# Patient Record
Sex: Male | Born: 1970 | Race: White | Hispanic: No | Smoking: Never smoker
Health system: Southern US, Community
[De-identification: ages and names within clinical notes are randomized; demographics above are authoritative.]

## PROBLEM LIST (undated history)

## (undated) DIAGNOSIS — N200 Calculus of kidney: Secondary | ICD-10-CM

## (undated) DIAGNOSIS — R300 Dysuria: Secondary | ICD-10-CM

## (undated) DIAGNOSIS — R000 Tachycardia, unspecified: Secondary | ICD-10-CM

## (undated) DIAGNOSIS — F32A Depression, unspecified: Secondary | ICD-10-CM

## (undated) DIAGNOSIS — M109 Gout, unspecified: Secondary | ICD-10-CM

## (undated) DIAGNOSIS — I499 Cardiac arrhythmia, unspecified: Secondary | ICD-10-CM

## (undated) DIAGNOSIS — M1A9XX0 Chronic gout, unspecified, without tophus (tophi): Secondary | ICD-10-CM

## (undated) DIAGNOSIS — F329 Major depressive disorder, single episode, unspecified: Secondary | ICD-10-CM

## (undated) DIAGNOSIS — Z87442 Personal history of urinary calculi: Secondary | ICD-10-CM

## (undated) DIAGNOSIS — J189 Pneumonia, unspecified organism: Secondary | ICD-10-CM

## (undated) DIAGNOSIS — I10 Essential (primary) hypertension: Secondary | ICD-10-CM

## (undated) DIAGNOSIS — E119 Type 2 diabetes mellitus without complications: Secondary | ICD-10-CM

## (undated) DIAGNOSIS — M1990 Unspecified osteoarthritis, unspecified site: Secondary | ICD-10-CM

## (undated) DIAGNOSIS — R3915 Urgency of urination: Secondary | ICD-10-CM

## (undated) DIAGNOSIS — F419 Anxiety disorder, unspecified: Secondary | ICD-10-CM

## (undated) DIAGNOSIS — R319 Hematuria, unspecified: Secondary | ICD-10-CM

## (undated) DIAGNOSIS — R7303 Prediabetes: Secondary | ICD-10-CM

## (undated) HISTORY — PX: WISDOM TOOTH EXTRACTION: SHX21

## (undated) HISTORY — PX: EYE SURGERY: SHX253

## (undated) HISTORY — PX: OTHER SURGICAL HISTORY: SHX169

## (undated) HISTORY — DX: Gout, unspecified: M10.9

---
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
## 2003-03-24 ENCOUNTER — Emergency Department (HOSPITAL_COMMUNITY): Admission: AD | Admit: 2003-03-24 | Discharge: 2003-03-24 | Payer: Self-pay | Admitting: Family Medicine

## 2012-11-09 ENCOUNTER — Ambulatory Visit: Payer: Self-pay | Admitting: Family Medicine

## 2014-08-15 ENCOUNTER — Other Ambulatory Visit: Payer: Self-pay | Admitting: Family Medicine

## 2015-01-01 ENCOUNTER — Ambulatory Visit (INDEPENDENT_AMBULATORY_CARE_PROVIDER_SITE_OTHER): Payer: 59 | Admitting: Physician Assistant

## 2015-01-01 ENCOUNTER — Telehealth: Payer: Self-pay | Admitting: Family Medicine

## 2015-01-01 VITALS — BP 124/76 | HR 95 | Temp 98.6°F | Resp 14 | Ht 71.0 in | Wt 228.0 lb

## 2015-01-01 DIAGNOSIS — F329 Major depressive disorder, single episode, unspecified: Secondary | ICD-10-CM

## 2015-01-01 DIAGNOSIS — Z139 Encounter for screening, unspecified: Secondary | ICD-10-CM

## 2015-01-01 DIAGNOSIS — F32A Depression, unspecified: Secondary | ICD-10-CM | POA: Insufficient documentation

## 2015-01-01 LAB — POCT CBC
Granulocyte percent: 69.9 %G (ref 37–80)
HCT, POC: 50 % (ref 43.5–53.7)
Hemoglobin: 17.2 g/dL (ref 14.1–18.1)
Lymph, poc: 2.9 (ref 0.6–3.4)
MCH, POC: 30.7 pg (ref 27–31.2)
MCHC: 34.4 g/dL (ref 31.8–35.4)
MCV: 89.2 fL (ref 80–97)
MID (cbc): 0.7 (ref 0–0.9)
MPV: 8.3 fL (ref 0–99.8)
POC Granulocyte: 8.2 — AB (ref 2–6.9)
POC LYMPH PERCENT: 24.4 %L (ref 10–50)
POC MID %: 5.7 %M (ref 0–12)
Platelet Count, POC: 227 10*3/uL (ref 142–424)
RBC: 5.61 M/uL (ref 4.69–6.13)
RDW, POC: 13.7 %
WBC: 11.8 10*3/uL — AB (ref 4.6–10.2)

## 2015-01-01 MED ORDER — CLONAZEPAM 1 MG PO TABS: 0.5000 mg | ORAL_TABLET | Freq: Two times a day (BID) | ORAL | Status: DC

## 2015-01-01 MED ORDER — FLUOXETINE HCL (PMDD) 20 MG PO CAPS: 20.0000 mg | ORAL_CAPSULE | Freq: Every day | ORAL | Status: DC

## 2015-01-01 NOTE — Telephone Encounter (Signed)
Received a call from the answering service that he did not get one of his rx that he needed.  It was just a mis-understanding, issue resolved

## 2015-01-01 NOTE — Progress Notes (Signed)
01/01/2015 2:29 PM   DOB: 02/13/1970 / MRN: [id number]  SUBJECTIVE:  Derrick Barr is a 44 y.o. male presenting for anxiousness and depression that started roughly 1 year ago after a separation with his wife. 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  Reports that his concentration is so bad that he boss has started to notice.  Complains of early morning awakenings roughly twice a week, and states it takes him about 30 minutes to go back to sleep.  Also, he feels that he has slowed down in his thinking, speaking and moving.  Denies SI/HI today, but did feel this way somewhat near the end of the relationship last year.       Depression screen PHQ 2/9 01/01/2015  Decreased Interest 3  Down, Depressed, Hopeless 3  PHQ - 2 Score 6  Altered sleeping 2  Tired, decreased energy 3  Change in appetite 2  Feeling bad or failure about yourself  3  Trouble concentrating 2  Moving slowly or fidgety/restless 2  Suicidal thoughts 1  PHQ-9 Score 21  Difficult doing work/chores Very difficult      He has No Known Allergies.   He  has a past medical history of Gout.    He  reports that he has never smoked. He does not have any smokeless tobacco history on file. He reports that he does not drink alcohol or use illicit drugs. He  has no sexual activity history on file. The patient  has no past surgical history on file.  His family history is not on file.  Review of Systems  Constitutional: Negative for fever and chills.  Eyes: Negative for blurred vision.  Respiratory: Negative for cough and shortness of breath.   Cardiovascular: Negative for chest pain.  Gastrointestinal: Negative for nausea and abdominal pain.  Genitourinary: Negative for dysuria, urgency and frequency.  Musculoskeletal: Negative for myalgias.  Skin: Negative for rash.  Neurological: Negative for dizziness, tingling  and headaches.  Psychiatric/Behavioral: Negative for depression. The patient is not nervous/anxious.     Problem list and medications reviewed and updated by myself where necessary, and exist elsewhere in the encounter.   OBJECTIVE:  BP 124/76 mmHg  Pulse 95  Temp(Src) 98.6 F (37 C) (Oral)  Resp 14  Ht 5\' 11"  (1.803 m)  Wt 228 lb (103.42 kg)  BMI 31.81 kg/m2  SpO2 98% CrCl cannot be calculated (Patient has no serum creatinine result on file.).  Physical Exam  Constitutional: He is oriented to person, place, and time. He appears well-developed. He does not appear ill.  Eyes: Conjunctivae and EOM are normal. Pupils are equal, round, and reactive to light.  Cardiovascular: Normal rate.   Pulmonary/Chest: Effort normal.  Abdominal: He exhibits no distension.  Musculoskeletal: Normal range of motion.  Neurological: He is alert and oriented to person, place, and time. No cranial nerve deficit. Coordination normal.  Skin: Skin is warm and dry. He is not diaphoretic.  Psychiatric: He has a normal mood and affect.  Nursing note and vitals reviewed.   No results found for this or any previous visit (from the past 48 hour(s)).  ASSESSMENT AND PLAN  Derrick Barr was seen today for anxiety and depression.  Diagnoses and all orders for this visit:  Depression (emotion): Patient with a significant psychological burden.  Will treat with Prozac 20 mg and see him back  in 30 days for medication titration.  Klonopin for now for any agitation associated with SSRI.  Will send him to psychotherapy.  -     Fluoxetine HCl, PMDD, 20 MG CAPS; Take 1 capsule (20 mg total) by mouth daily. -     clonazePAM (KLONOPIN) 1 MG tablet; Take 0.5-1 tablets (0.5-1 mg total) by mouth 2 (two) times daily. Take for anxiety only. -     Ambulatory referral to Psychology  Screening -     POCT CBC -     TSH    The patient was advised to call or return to clinic if he does not see an improvement in symptoms or to seek  the care of the closest emergency department if he worsens with the above plan.   [name], MHS, PA-C [facility] 01/01/2015 2:29 PM

## 2015-01-02 LAB — TSH: TSH: 1.225 u[IU]/mL (ref 0.350–4.500)

## 2015-02-01 ENCOUNTER — Ambulatory Visit: Payer: 59 | Admitting: Physician Assistant

## 2015-07-17 ENCOUNTER — Other Ambulatory Visit: Payer: Self-pay | Admitting: Family Medicine

## 2015-11-26 ENCOUNTER — Emergency Department (HOSPITAL_COMMUNITY)
Admission: EM | Admit: 2015-11-26 | Discharge: 2015-11-26 | Disposition: A | Discharge status: Home / Self Care | Payer: PRIVATE HEALTH INSURANCE | Attending: Emergency Medicine | Admitting: Emergency Medicine

## 2015-11-26 ENCOUNTER — Emergency Department (HOSPITAL_COMMUNITY): Payer: PRIVATE HEALTH INSURANCE

## 2015-11-26 DIAGNOSIS — R109 Unspecified abdominal pain: Secondary | ICD-10-CM

## 2015-11-26 DIAGNOSIS — N23 Unspecified renal colic: Secondary | ICD-10-CM

## 2015-11-26 DIAGNOSIS — N202 Calculus of kidney with calculus of ureter: Secondary | ICD-10-CM | POA: Insufficient documentation

## 2015-11-26 DIAGNOSIS — R1032 Left lower quadrant pain: Secondary | ICD-10-CM | POA: Diagnosis present

## 2015-11-26 LAB — BASIC METABOLIC PANEL
Anion gap: 11 (ref 5–15)
BUN: 11 mg/dL (ref 6–20)
CO2: 25 mmol/L (ref 22–32)
Calcium: 9.6 mg/dL (ref 8.9–10.3)
Chloride: 104 mmol/L (ref 101–111)
Creatinine, Ser: 1.46 mg/dL — ABNORMAL HIGH (ref 0.61–1.24)
GFR calc Af Amer: >60 mL/min (ref >60)
GFR calc non Af Amer: 56 mL/min — ABNORMAL LOW (ref >60)
Glucose, Bld: 132 mg/dL — ABNORMAL HIGH (ref 65–99)
Potassium: 3.8 mmol/L (ref 3.5–5.1)
Sodium: 140 mmol/L (ref 135–145)

## 2015-11-26 LAB — CBC
HCT: 49.6 % (ref 39.0–52.0)
Hemoglobin: 17.3 g/dL — ABNORMAL HIGH (ref 13.0–17.0)
MCH: 31.1 pg (ref 26.0–34.0)
MCHC: 34.9 g/dL (ref 30.0–36.0)
MCV: 89.2 fL (ref 78.0–100.0)
Platelets: 296 10*3/uL (ref 150–400)
RBC: 5.56 MIL/uL (ref 4.22–5.81)
RDW: 12.9 % (ref 11.5–15.5)
WBC: 17.4 10*3/uL — ABNORMAL HIGH (ref 4.0–10.5)

## 2015-11-26 MED ORDER — OXYCODONE-ACETAMINOPHEN 5-325 MG PO TABS
ORAL_TABLET | ORAL | Status: AC
  Filled 2015-11-26: qty 1

## 2015-11-26 MED ORDER — HYDROMORPHONE HCL 2 MG/ML IJ SOLN
INTRAMUSCULAR | Status: AC
  Filled 2015-11-26: qty 1

## 2015-11-26 MED ORDER — ONDANSETRON HCL 4 MG/2ML IJ SOLN
INTRAMUSCULAR | Status: AC
  Filled 2015-11-26: qty 2

## 2015-11-26 MED ORDER — ONDANSETRON 4 MG PO TBDP
ORAL_TABLET | ORAL | Status: AC
  Filled 2015-11-26: qty 1

## 2015-11-26 MED ORDER — ONDANSETRON HCL 4 MG PO TABS: 4.0000 mg | ORAL_TABLET | Freq: Four times a day (QID) | ORAL | 0 refills | Status: DC

## 2015-11-26 MED ORDER — TAMSULOSIN HCL 0.4 MG PO CAPS: 0.4000 mg | ORAL_CAPSULE | Freq: Every day | ORAL | 0 refills | Status: DC

## 2015-11-26 MED ORDER — OXYCODONE-ACETAMINOPHEN 5-325 MG PO TABS: 1.0000 | ORAL_TABLET | ORAL | 0 refills | Status: DC | PRN

## 2015-11-26 NOTE — ED Provider Notes (Signed)
[facility]-EMERGENCY DEPT Provider Note   CSN: [id number] Arrival date & time: 11/26/15  0153   By signing my name below, I, Valentino SaxonBianca Contreras, attest that this documentation has been prepared under the direction and in the presence of Gilda Creasehristopher J Pollina, MD. Electronically Signed: Valentino SaxonBianca Contreras, ED Scribe. 11/26/15. 3:11 AM.  History   Chief Complaint No chief complaint on file.  The history is provided by the patient. No language interpreter was used.    HPI Comments: Derrick Barr is a 45 y.o. male who presents to the Emergency Department complaining of moderate, constant, lower left abdominal and flank pain with associated back pain onset at ~6pm yesterday. Pt reports his abdominal and flank pain radiates to his lower left back accompanied by nausea. Pt states he was driving home from work when he began to have a stabbing pain to his left lower abdomen. Per pt, he notes having a hx of one kidney stone. He describes his pain is worse than previous symptoms. Pt reports no modifying factors noted. He also notes his urine has blood and brown color to it. He denies dysuria, fever. No additional complaints at this time.   Past Medical History:  Diagnosis Date  . Gout     Patient Active Problem List   Diagnosis Date Noted  . Depression (emotion) 01/01/2015    No past surgical history on file.     Home Medications    Prior to Admission medications   Medication Sig Start Date End Date Taking? Authorizing Provider  allopurinol (ZYLOPRIM) 100 MG tablet TAKE 1 TABLET TWICE A DAY 08/15/14  Yes Dennis E Chrismon, PA  clonazePAM (KLONOPIN) 1 MG tablet Take 0.5-1 tablets (0.5-1 mg total) by mouth 2 (two) times daily. Take for anxiety only. Patient not taking: Reported on 11/26/2015 01/01/15   Ofilia NeasMichael L Clark, PA-C  Fluoxetine HCl, PMDD, 20 MG CAPS Take 1 capsule (20 mg total) by mouth daily. Patient not taking: Reported on 11/26/2015 01/01/15   Ofilia NeasMichael L Clark, PA-C  ondansetron  (ZOFRAN) 4 MG tablet Take 1 tablet (4 mg total) by mouth every 6 (six) hours. 11/26/15   Gilda Creasehristopher J Pollina, MD  oxyCODONE-acetaminophen (PERCOCET) 5-325 MG tablet Take 1-2 tablets by mouth every 4 (four) hours as needed. 11/26/15   Gilda Creasehristopher J Pollina, MD  tamsulosin (FLOMAX) 0.4 MG CAPS capsule Take 1 capsule (0.4 mg total) by mouth daily. 11/26/15   Gilda Creasehristopher J Pollina, MD    Family History No family history on file.  Social History Social History  Substance Use Topics  . Smoking status: Never Smoker  . Smokeless tobacco: Not on file  . Alcohol use No     Allergies   Patient has no known allergies.   Review of Systems Review of Systems  Constitutional: Negative for fever.  Gastrointestinal: Positive for abdominal pain and nausea.  Genitourinary: Positive for flank pain. Negative for dysuria.  Musculoskeletal: Positive for back pain (lower left).  All other systems reviewed and are negative.    Physical Exam Updated Vital Signs There were no vitals taken for this visit.  Physical Exam  Constitutional: He is oriented to person, place, and time. He appears well-developed and well-nourished. No distress.  HENT:  Head: Normocephalic and atraumatic.  Right Ear: Hearing normal.  Left Ear: Hearing normal.  Nose: Nose normal.  Mouth/Throat: Oropharynx is clear and moist and mucous membranes are normal.  Eyes: Conjunctivae and EOM are normal. Pupils are equal, round, and reactive to light.  Neck: Normal range  of motion. Neck supple.  Cardiovascular: Regular rhythm, S1 normal and S2 normal.  Exam reveals no gallop and no friction rub.   No murmur heard. Pulmonary/Chest: Effort normal and breath sounds normal. No respiratory distress. He exhibits no tenderness.  Abdominal: Soft. Normal appearance and bowel sounds are normal. There is no hepatosplenomegaly. There is no tenderness. There is no rebound, no guarding, no tenderness at McBurney's point and negative Murphy's sign.  No hernia.  Musculoskeletal: Normal range of motion.  Neurological: He is alert and oriented to person, place, and time. He has normal strength. No cranial nerve deficit or sensory deficit. Coordination normal. GCS eye subscore is 4. GCS verbal subscore is 5. GCS motor subscore is 6.  Skin: Skin is warm, dry and intact. No rash noted. No cyanosis.  Psychiatric: He has a normal mood and affect. His speech is normal and behavior is normal. Thought content normal.  Nursing note and vitals reviewed.    ED Treatments / Results   DIAGNOSTIC STUDIES:     COORDINATION OF CARE: 3:09 AM Discussed treatment plan with pt at bedside which includes labs and pt agreed to plan.   Labs (all labs ordered are listed, but only abnormal results are displayed) Labs Reviewed  BASIC METABOLIC PANEL - Abnormal; Notable for the following:       Result Value   Glucose, Bld 132 (*)    Creatinine, Ser 1.46 (*)    GFR calc non Af Amer 56 (*)    All other components within normal limits  CBC - Abnormal; Notable for the following:    WBC 17.4 (*)    Hemoglobin 17.3 (*)    All other components within normal limits    EKG  EKG Interpretation None       Radiology Ct Renal Stone Study  Result Date: 11/26/2015 CLINICAL DATA:  Left flank pain EXAM: CT ABDOMEN AND PELVIS WITHOUT CONTRAST TECHNIQUE: Multidetector CT imaging of the abdomen and pelvis was performed following the standard protocol without IV contrast. COMPARISON:  None. FINDINGS: Lower chest: No pulmonary nodules or pleural effusion. No visible pericardial effusion. Hepatobiliary: Normal noncontrast appearance of the liver. No visible biliary dilatation. Normal gallbladder. Pancreas: Normal noncontrast appearance of the pancreas. No peripancreatic fluid collection. Spleen: Normal. Adrenal glands: Normal. Urinary Tract: --Right kidney: There is right nephrolithiasis with calculi measuring up to 7 mm, the largest of which is at the lower pole. There  is a large right renal cyst measuring up to 7.8 cm. No right ureteral obstruction. No right perinephric stranding or hydronephrosis. --Left kidney: There are multiple calculi within the left kidney, measuring up to 3 mm. There is mild perinephric stranding. No hydronephrosis. There is a 4 mm stone at the left ureterovesical junction. --Urinary bladder: Unremarkable. Stomach/Bowel: There is rectosigmoid and descending colonic diverticulosis without acute inflammation. The appendix is normal. No dilatation of the small bowel. No abdominal fluid collection. No portal venous gas or pneumobilia. Vascular/Lymphatic: No abdominal aortic aneurysm or atherosclerotic calcification. No abdominal or pelvic lymphadenopathy. Reproductive: Normal prostate and seminal vesicles. Musculoskeletal. No focal osseous lesion. Normal visualized extraperitoneal and extrathoracic soft tissues. IMPRESSION: 1. Left ureterolithiasis with stone at the ureterovesical junction. Mild left perinephric stranding without hydronephrosis. 2. Bilateral nephrolithiasis with largest stone, at the right lower pole, measuring 7 mm. 3. Rectosigmoid and descending colonic diverticulosis without acute diverticulitis. Electronically Signed   By: Deatra RobinsonKevin  Herman M.D.   On: 11/26/2015 03:29    Procedures Procedures (including critical care time)  Medications  Ordered in ED Medications  HYDROmorphone (DILAUDID) 2 MG/ML injection (not administered)  ondansetron (ZOFRAN) 4 MG/2ML injection (not administered)     Initial Impression / Assessment and Plan / ED Course  I have reviewed the triage vital signs and the nursing notes.  Pertinent labs & imaging results that were available during my care of the patient were reviewed by me and considered in my medical decision making (see chart for details).  Clinical Course     Patient presents to the ER for evaluation of sudden onset of left flank pain. Patient does report a previous history of one kidney  stone in the past. Symptoms did seem consistent with renal colic. CAT scan has confirmed a 4 mm UVJ stone which explains his current pain. He is pain-free after analgesia. Will discharge with analgesia, follow up with urology.  Final Clinical Impressions(s) / ED Diagnoses   Final diagnoses:  Renal colic on left side    New Prescriptions New Prescriptions   ONDANSETRON (ZOFRAN) 4 MG TABLET    Take 1 tablet (4 mg total) by mouth every 6 (six) hours.   OXYCODONE-ACETAMINOPHEN (PERCOCET) 5-325 MG TABLET    Take 1-2 tablets by mouth every 4 (four) hours as needed.   TAMSULOSIN (FLOMAX) 0.4 MG CAPS CAPSULE    Take 1 capsule (0.4 mg total) by mouth daily.    I personally performed the services described in this documentation, which was scribed in my presence. The recorded information has been reviewed and is accurate.     Gilda Crease, MD 11/26/15 [phone]

## 2019-02-22 ENCOUNTER — Other Ambulatory Visit: Payer: Self-pay

## 2019-02-22 ENCOUNTER — Ambulatory Visit (INDEPENDENT_AMBULATORY_CARE_PROVIDER_SITE_OTHER): Payer: 59

## 2019-02-22 ENCOUNTER — Encounter: Payer: Self-pay | Admitting: Registered Nurse

## 2019-02-22 ENCOUNTER — Other Ambulatory Visit: Payer: Self-pay | Admitting: Registered Nurse

## 2019-02-22 ENCOUNTER — Telehealth: Payer: Self-pay | Admitting: Registered Nurse

## 2019-02-22 ENCOUNTER — Ambulatory Visit (INDEPENDENT_AMBULATORY_CARE_PROVIDER_SITE_OTHER): Payer: 59 | Admitting: Registered Nurse

## 2019-02-22 VITALS — BP 151/96 | HR 99 | Temp 97.4°F | Ht 71.0 in | Wt 239.2 lb

## 2019-02-22 DIAGNOSIS — M25552 Pain in left hip: Secondary | ICD-10-CM

## 2019-02-22 DIAGNOSIS — M25562 Pain in left knee: Secondary | ICD-10-CM

## 2019-02-22 DIAGNOSIS — G8929 Other chronic pain: Secondary | ICD-10-CM | POA: Diagnosis not present

## 2019-02-22 DIAGNOSIS — M84469A Pathological fracture, unspecified tibia and fibula, initial encounter for fracture: Secondary | ICD-10-CM

## 2019-02-22 DIAGNOSIS — M25561 Pain in right knee: Secondary | ICD-10-CM

## 2019-02-22 MED ORDER — OXYCODONE HCL 7.5 MG PO TABS: 7.5000 mg | ORAL_TABLET | Freq: Three times a day (TID) | ORAL | 0 refills | Status: DC

## 2019-02-22 MED ORDER — OXYCODONE-ACETAMINOPHEN 10-325 MG PO TABS: 1.0000 | ORAL_TABLET | Freq: Three times a day (TID) | ORAL | 0 refills | Status: AC | PRN

## 2019-02-22 NOTE — Telephone Encounter (Signed)
Rx sent  Rich Liza Czerwinski, NP

## 2019-02-22 NOTE — Progress Notes (Signed)
Acute Office Visit  Subjective:    Patient ID: Derrick Barr, male    DOB: [birth year], 49 y.o.   MRN: [id number]  Chief Complaint  Patient presents with  . Gout    patient stated he is having problems with his hip and knees on the left side . per patient this is the is the first day he has been able to get up . ALso would like to talk about a referral to pulmonology    HPI Patient is in today for acute on chronic L hip and bilateral knee pain. Has had hx of gout, but has modified his diet such that flares are infrequent. He states that this feels like a flair, though, and he has been essentially bed bound with pain over the past three days. Baseline pain has been becoming worse, acute pain in L hip and L knee very bad. L hip pain radiates upwards, L knee pain radiates downward. No back pain, incontinence, sciatic radiculopathy, numbness weakness tingling of extremities, acute injury  Past Medical History:  Diagnosis Date  . Gout     History reviewed. No pertinent surgical history.  History reviewed. No pertinent family history.  Social History   Socioeconomic History  . Marital status: Married    Spouse name: Not on file  . Number of children: Not on file  . Years of education: Not on file  . Highest education level: Not on file  Occupational History  . Not on file  Tobacco Use  . Smoking status: Never Smoker  . Smokeless tobacco: Never Used  Substance and Sexual Activity  . Alcohol use: No    Alcohol/week: 0.0 standard drinks  . Drug use: No  . Sexual activity: Yes  Other Topics Concern  . Not on file  Social History Narrative  . Not on file   Social Determinants of Health   Financial Resource Strain:   . Difficulty of Paying Living Expenses: Not on file  Food Insecurity:   . Worried About Programme researcher, broadcasting/film/video in the Last Year: Not on file  . Ran Out of Food in the Last Year: Not on file  Transportation Needs:   . Lack of Transportation (Medical): Not on  file  . Lack of Transportation (Non-Medical): Not on file  Physical Activity:   . Days of Exercise per Week: Not on file  . Minutes of Exercise per Session: Not on file  Stress:   . Feeling of Stress : Not on file  Social Connections:   . Frequency of Communication with Friends and Family: Not on file  . Frequency of Social Gatherings with Friends and Family: Not on file  . Attends Religious Services: Not on file  . Active Member of Clubs or Organizations: Not on file  . Attends Banker Meetings: Not on file  . Marital Status: Not on file  Intimate Partner Violence:   . Fear of Current or Ex-Partner: Not on file  . Emotionally Abused: Not on file  . Physically Abused: Not on file  . Sexually Abused: Not on file    Outpatient Medications Prior to Visit  Medication Sig Dispense Refill  . allopurinol (ZYLOPRIM) 100 MG tablet TAKE 1 TABLET TWICE A DAY 60 tablet 6  . clonazePAM (KLONOPIN) 1 MG tablet Take 0.5-1 tablets (0.5-1 mg total) by mouth 2 (two) times daily. Take for anxiety only. (Patient not taking: Reported on 11/26/2015) 20 tablet 0  . Fluoxetine HCl, PMDD, 20 MG CAPS Take  1 capsule (20 mg total) by mouth daily. (Patient not taking: Reported on 11/26/2015) 30 each 2  . ondansetron (ZOFRAN) 4 MG tablet Take 1 tablet (4 mg total) by mouth every 6 (six) hours. (Patient not taking: Reported on 02/22/2019) 12 tablet 0  . oxyCODONE-acetaminophen (PERCOCET) 5-325 MG tablet Take 1-2 tablets by mouth every 4 (four) hours as needed. (Patient not taking: Reported on 02/22/2019) 20 tablet 0  . tamsulosin (FLOMAX) 0.4 MG CAPS capsule Take 1 capsule (0.4 mg total) by mouth daily. (Patient not taking: Reported on 02/22/2019) 7 capsule 0   No facility-administered medications prior to visit.    No Known Allergies  Review of Systems  Constitutional: Negative.   HENT: Negative.   Eyes: Negative.   Respiratory: Negative.   Cardiovascular: Negative.   Gastrointestinal: Negative.    Endocrine: Negative.   Genitourinary: Negative.   Musculoskeletal: Positive for arthralgias (L hip, bilat knees), gait problem (d/t pain) and joint swelling (bilat knees). Negative for back pain, myalgias, neck pain and neck stiffness.  Skin: Negative.   Allergic/Immunologic: Negative.   Hematological: Negative.   Psychiatric/Behavioral: Negative.   All other systems reviewed and are negative.      Objective:    Physical Exam  BP (!) 151/96   Pulse 99   Temp (!) 97.4 F (36.3 C) (Temporal)   Ht 5\' 11"  (1.803 m)   Wt 239 lb 3.2 oz (108.5 kg)   SpO2 96%   BMI 33.36 kg/m  Wt Readings from Last 3 Encounters:  02/22/19 239 lb 3.2 oz (108.5 kg)  01/01/15 228 lb (103.4 kg)    Health Maintenance Due  Topic Date Due  . HIV Screening  07/22/1985    There are no preventive care reminders to display for this patient.   Lab Results  Component Value Date   TSH 1.225 01/01/2015   Lab Results  Component Value Date   WBC 17.4 (H) 11/26/2015   HGB 17.3 (H) 11/26/2015   HCT 49.6 11/26/2015   MCV 89.2 11/26/2015   PLT 296 11/26/2015   Lab Results  Component Value Date   NA 140 11/26/2015   K 3.8 11/26/2015   CO2 25 11/26/2015   GLUCOSE 132 (H) 11/26/2015   BUN 11 11/26/2015   CREATININE 1.46 (H) 11/26/2015   CALCIUM 9.6 11/26/2015   ANIONGAP 11 11/26/2015   No results found for: CHOL No results found for: HDL No results found for: LDLCALC No results found for: TRIG No results found for: CHOLHDL No results found for: HGBA1C     Assessment & Plan:   Problem List Items Addressed This Visit    None    Visit Diagnoses    Chronic pain of both knees    -  Primary   Relevant Medications   oxyCODONE HCl 7.5 MG TABS   Other Relevant Orders   DG Knee Complete 4 Views Left (Completed)   DG Knee Complete 4 Views Right (Completed)   MR Knee Left  Wo Contrast   Comprehensive metabolic panel   CBC with Differential   Vitamin D, 25-hydroxy   Ambulatory referral to  Orthopedic Surgery   Left hip pain       Relevant Orders   DG Hip Unilat W OR W/O Pelvis 2-3 Views Left (Completed)   Ambulatory referral to Orthopedic Surgery   Insufficiency fracture of tibia, initial encounter       Relevant Medications   oxyCODONE HCl 7.5 MG TABS   Other Relevant Orders  MR Knee Left  Wo Contrast   Comprehensive metabolic panel   CBC with Differential   Vitamin D, 25-hydroxy   Ambulatory referral to Orthopedic Surgery       Meds ordered this encounter  Medications  . oxyCODONE HCl 7.5 MG TABS    Sig: Take 7.5 mg by mouth 3 (three) times daily.    Dispense:  42 tablet    Refill:  0    Order Specific Question:   Supervising Provider    Answer:   Collie Siad A [id number]   PLAN  Xray concerning for insufficiency fracture of L tibia.  Will order mri to confirm  Refer to ortho surg  Pt fitted for crutches, educated on use, and demonstrated competent use before leaving the office  Oxycodone for pain relief  Labs drawn  R knee and L hip showing degenerative changes. I believe the pain is worsening in these areas in compensation for L knee insufficiency fracture. Encouraged rest as much as possible to avoid further exacerbating these areas.  Patient encouraged to call clinic with any questions, comments, or concerns.   Janeece Agee, NP

## 2019-02-22 NOTE — Telephone Encounter (Signed)
Sent to pharmacy requested Goodrx may be able to help if his insurance will not cover - if there is a better dose/dispense amt for goodrx he can let me know  Jari Sportsman, NP

## 2019-02-22 NOTE — Telephone Encounter (Signed)
Pharmacy called stating that they do not have any of the oxycodone that was sent in for the pt. Pharmacy states that they do not have the pts most recent insurance but that the medication was very expensive without it. Pharmacy states that the store at [facility] does have this medication. Please advise.

## 2019-02-22 NOTE — Telephone Encounter (Signed)
Please advise 

## 2019-02-22 NOTE — Patient Instructions (Addendum)
If you have lab work done today you will be contacted with your lab results within the next 2 weeks.  If you have not heard from [place] then please contact us. The fastest way to get your results is to register for My Chart.   IF you received an x-ray today, you will receive an invoice from [facility]. Please contact [facility] at [phone] with questions or concerns regarding your invoice.   IF you received labwork today, you will receive an invoice from [place]. Please contact [facility] at [phone] with questions or concerns regarding your invoice.   Our billing staff will not be able to assist you with questions regarding bills from these companies.  You will be contacted with the lab results as soon as they are available. The fastest way to get your results is to activate your My Chart account. Instructions are located on the last page of this paperwork. If you have not heard from [place] regarding the results in 2 weeks, please contact this office.     Crutch Use, Adult Crutches are used to take weight off of one of your legs or feet when you stand or walk. You may need crutches to help you heal after an injury or procedure. It is important to use crutches that fit properly. When fitted properly:  Each crutch should be 2-3 finger widths below the armpit.  Your weight should be supported by your hand and not by resting your armpit on the crutch. It is important that a health care provider has seen you use crutches effectively before you use them at home. What are the risks? Improper use of crutches can injure your shoulders, arms, back, armpits, wrists, and hands. To prevent this from happening, make sure your crutches fit properly, and do not put pressure on your armpits when using the crutches. While using crutches, you also have a higher risk of falling. To prevent falls while using crutches at home or work:  Move furniture or barriers that are in your walkway  when possible. Have someone help you with this as needed.  Remove rugs, cords, and other items that you can trip on. Have someone help you with this as needed.  Keep walkways well lit.  Use a backpack so you do not need to carry items in your hands. How to use your crutches How you use your crutches will depend on the reason you need them. Your health care provider may tell you not to put any weight on the affected leg (non-weight-bearing). Or your health care provider may allow you to put some, but not all, weight on the affected leg (partial weight-bearing). Follow instructions from your health care provider about weight-bearing. Do not bear weight in an amount that causes pain to the affected area. Walking  1. Stand on your healthy leg and lift both crutches at the same time. 2. Place the crutches one step-length in front of you. Keep your weight over the hand grips. 3. Bring your healthy leg forward to meet, or land slightly ahead of, the crutches. 4. Repeat. Going up steps If there is no handrail: 1. Walk up to steps and put weight on hand grips to step up. 2. Step up with the healthy leg. 3. Step up with the crutches and injured leg. 4. Repeat. If there is a handrail: 1. Hold both crutches in one hand. 2. Place your other hand on the handrail. 3. Place your weight on your arms and step up  with your healthy leg. 4. Bring the crutches and the injured leg up to that step. 5. Continue in this way. If you feel unsteady on steps, you can go up steps on your buttocks. To go up, sit on the lowest step with your injured leg in front and holding both crutches flat against the stairs in one hand. Then use your free hand and your healthy leg for support to scoot your buttocks up to the next step. Going down steps If there is no handrail: 1. Step down with the injured leg and crutches. Make sure to keep the crutch tips in the center of the step, not close to the front edge. 2. Step down with  the healthy leg. 3. Repeat. If there is a handrail: 1. Place one hand on the handrail. 2. Hold both crutches with your free hand. 3. Lower your injured leg and crutches to the step below you. Make sure to keep the crutch tips in the center of the step, not close to the front edge. 4. Lower your healthy leg down to the next step. 5. Repeat. If you feel unsteady on steps, you can go down steps on your buttocks. To go down, sit on the highest step with your injured leg in front and holding both crutches flat against the stairs in the other hand. Then use your free hand and your healthy leg for support to scoot your buttocks down to the next step. Standing up  Move to the edge of the seat. If there is an armrest: 1. Hold the injured leg forward. 2. Grab the armrest with one hand and the top of the crutches with the other hand. 3. Using the armrest and your crutches, pull yourself up to a standing position. If there is no armrest: 1. Hold the injured leg forward. 2. Hold on to the seat with one hand and the top of the crutches with the other hand. 3. Using the seat and your crutches, bring yourself up to a standing position. Sitting down  Move back until your leg touches the edge of the seat. If there is an armrest: 1. Hold the injured leg forward. 2. Grab the armrest with one hand and the top of the crutches with the other hand. 3. Slowly lower yourself to a sitting position. If there is no armrest: 1. Hold the injured leg forward. 2. Reach for and hold on to the seat with one hand and hold on to the top of the crutches with the other hand. 3. Slowly lower yourself to a sitting position. Contact a health care provider if:  You feel unsteady using crutches.  You develop any new pain.  You develop any numbness or tingling.  Your crutches do not fit. Get help right away if:  You fall. Summary  Crutches are used when you need to take weight off of one of your legs or feet to  stand or walk.  To prevent injury, make sure your crutches fit properly, and do not put pressure on your armpits when using the crutches.  Follow instructions from your health care provider about weight-bearing. This information is not intended to replace advice given to you by your health care provider. Make sure you discuss any questions you have with your health care provider. Document Revised: 07/29/2018 Document Reviewed: 07/29/2018 Elsevier Patient Education  2020 [employer].

## 2019-02-22 NOTE — Telephone Encounter (Signed)
Patient stated the pharmcy will cover this oxycodone as long as it was tablet and not capsule.

## 2019-02-23 ENCOUNTER — Ambulatory Visit: Payer: 59 | Admitting: Family Medicine

## 2019-02-23 LAB — CBC WITH DIFFERENTIAL/PLATELET
Basophils Absolute: 0.1 10*3/uL (ref 0.0–0.2)
Basos: 1 %
EOS (ABSOLUTE): 0.3 10*3/uL (ref 0.0–0.4)
Eos: 2 %
Hematocrit: 50.9 % (ref 37.5–51.0)
Hemoglobin: 18.4 g/dL — ABNORMAL HIGH (ref 13.0–17.7)
Immature Grans (Abs): 0 10*3/uL (ref 0.0–0.1)
Immature Granulocytes: 0 %
Lymphocytes Absolute: 3.2 10*3/uL — ABNORMAL HIGH (ref 0.7–3.1)
Lymphs: 22 %
MCH: 32.2 pg (ref 26.6–33.0)
MCHC: 36.1 g/dL — ABNORMAL HIGH (ref 31.5–35.7)
MCV: 89 fL (ref 79–97)
Monocytes Absolute: 1 10*3/uL — ABNORMAL HIGH (ref 0.1–0.9)
Monocytes: 7 %
Neutrophils Absolute: 9.8 10*3/uL — ABNORMAL HIGH (ref 1.4–7.0)
Neutrophils: 68 %
Platelets: 340 10*3/uL (ref 150–450)
RBC: 5.71 x10E6/uL (ref 4.14–5.80)
RDW: 13.1 % (ref 11.6–15.4)
WBC: 14.3 10*3/uL — ABNORMAL HIGH (ref 3.4–10.8)

## 2019-02-23 LAB — COMPREHENSIVE METABOLIC PANEL
ALT: 22 IU/L (ref 0–44)
AST: 22 IU/L (ref 0–40)
Albumin/Globulin Ratio: 1.8 (ref 1.2–2.2)
Albumin: 5.1 g/dL — ABNORMAL HIGH (ref 4.0–5.0)
Alkaline Phosphatase: 95 IU/L (ref 39–117)
BUN/Creatinine Ratio: 11 (ref 9–20)
BUN: 12 mg/dL (ref 6–24)
Bilirubin Total: 0.5 mg/dL (ref 0.0–1.2)
CO2: 25 mmol/L (ref 20–29)
Calcium: 10.6 mg/dL — ABNORMAL HIGH (ref 8.7–10.2)
Chloride: 101 mmol/L (ref 96–106)
Creatinine, Ser: 1.14 mg/dL (ref 0.76–1.27)
GFR calc Af Amer: 87 mL/min/{1.73_m2} (ref >59)
GFR calc non Af Amer: 76 mL/min/{1.73_m2} (ref >59)
Globulin, Total: 2.9 g/dL (ref 1.5–4.5)
Glucose: 106 mg/dL — ABNORMAL HIGH (ref 65–99)
Potassium: 4.3 mmol/L (ref 3.5–5.2)
Sodium: 144 mmol/L (ref 134–144)
Total Protein: 8 g/dL (ref 6.0–8.5)

## 2019-02-23 LAB — VITAMIN D 25 HYDROXY (VIT D DEFICIENCY, FRACTURES): Vit D, 25-Hydroxy: 27.7 ng/mL — ABNORMAL LOW (ref 30.0–100.0)

## 2019-02-24 ENCOUNTER — Other Ambulatory Visit: Payer: Self-pay | Admitting: Physician Assistant

## 2019-02-24 ENCOUNTER — Encounter: Payer: Self-pay | Admitting: Physician Assistant

## 2019-02-24 ENCOUNTER — Ambulatory Visit (HOSPITAL_COMMUNITY)
Admission: RE | Admit: 2019-02-24 | Discharge: 2019-02-24 | Disposition: A | Discharge status: Home / Self Care | Payer: 59 | Source: Ambulatory Visit | Attending: Physician Assistant | Admitting: Physician Assistant

## 2019-02-24 ENCOUNTER — Other Ambulatory Visit: Payer: Self-pay

## 2019-02-24 ENCOUNTER — Ambulatory Visit: Payer: 59 | Admitting: Orthopaedic Surgery

## 2019-02-24 ENCOUNTER — Telehealth: Payer: Self-pay

## 2019-02-24 DIAGNOSIS — Z77018 Contact with and (suspected) exposure to other hazardous metals: Secondary | ICD-10-CM

## 2019-02-24 DIAGNOSIS — M25562 Pain in left knee: Secondary | ICD-10-CM

## 2019-02-24 NOTE — Progress Notes (Signed)
Left lower extremity venous duplex has been completed. Preliminary results can be found in CV Proc through chart review.  Results were given to Marisue Ivan at [facility] office.  02/24/19 10:40 AM Olen Cordial RVT

## 2019-02-24 NOTE — Telephone Encounter (Signed)
thanks

## 2019-02-24 NOTE — Telephone Encounter (Signed)
Negative for DVT

## 2019-02-24 NOTE — Progress Notes (Addendum)
Office Visit Note   Patient: Derrick Barr           Date of Birth: [birth year]           MRN: [id number] Visit Date: 02/24/2019              Requested by: Maximiano Coss, NP [place],  [place] [address] PCP: Patient, [name]   Assessment & Plan: Visit Diagnoses:  1. Acute pain of left knee     Plan: The patient is in a moderate amount of pain to the left knee and is hardly able to ambulate or range his knee.  As he is not tender to the lateral joint line I believe this may be more of a gouty process or even a possible infection.  Given the fact that there is no effusion, I do not believe I will be able to obtain any joint fluid from aspiration.  We will obtain inflammatory markers to include CRP, sedimentation rate as well as CBC with differential and uric acid.  We will also go ahead and get an urgent MRI of the left knee.  We will follow up with [place] after the MRI.  In the meantime, he will continue to use crutches nonweightbearing.  I do not feel it is appropriate to go ahead order an lateral compartment unloader brace until we have the MRI.  He will ice and elevate as needed for pain and swelling.  He will call with any worsening symptoms. This note is not being shared with the patient for the following reason: To respect privacy (The patient or proxy has requested that the information not be shared).  Follow-Up Instructions: Return for after MRI.   Orders:  Orders Placed This Encounter  Procedures  . MR Knee Left w/o contrast  . Uric acid  . CBC with Differential/Platelet  . Sed Rate (ESR)  . C-reactive protein  . VAS [place] LOWER EXTREMITY VENOUS (DVT)   No orders of the defined types were placed in this encounter.     Procedures: No procedures performed   Clinical Data: No additional findings.   Subjective: Chief Complaint  Patient presents with  . Right Knee - Pain  . Left Knee - Pain  . Left Hip - Pain    HPI patient is a pleasant 49 year old  gentleman who comes in today with acute left knee pain.  This started approximately 3 days ago.  No injury or change in activity.  He then started having pain to the left hip and right knee both of which have almost resolved.  The left knee however has been persistent.  He was seen by his PCP x-rays were obtained which showed a possible insufficiency fracture to the lateral tibial plateau.  The patient was given crutches and has been primarily nonweightbearing.  He comes in today for further evaluation treatment recommendation.  The pain he has is primarily to the popliteal fossa.  Pain is constant but worse with ambulation.  He has been taking Tylenol, Aleve and oxycodone without relief of symptoms.  No fevers or chills.  He does note a history of gout to the left toe and both knees several years back.  He is on allopurinol for prophylaxis.  No previous cortisone injection or surgical intervention.  No recent illness.  Review of Systems as detailed in HPI.  All others reviewed and are negative.   Objective: Vital Signs: There were no vitals taken for this visit.  Physical Exam  well-developed well-nourished gentleman in no acute distress.  Alert and oriented x3.  Ortho Exam examination of the left knee shows no effusion.  Range of motion from approximately 45 to 90 degrees.  No warmth.  No erythema.  Mild medial joint line tenderness.  No lateral joint line tenderness.  He does have moderate tenderness to the popliteal fossa.  No calf tenderness and no swelling or erythema to the calf.  Negative Homans.  Ligaments are stable.  He is neurovascularly intact distally.  Specialty Comments:  No specialty comments available.  Imaging: No new imaging   PMFS History: Patient Active Problem List   Diagnosis Date Noted  . Depression (emotion) 01/01/2015   Past Medical History:  Diagnosis Date  . Gout     History reviewed. No pertinent family history.  History reviewed. No pertinent surgical  history. Social History   Occupational History  . Not on file  Tobacco Use  . Smoking status: Never Smoker  . Smokeless tobacco: Never Used  Substance and Sexual Activity  . Alcohol use: No    Alcohol/week: 0.0 standard drinks  . Drug use: No  . Sexual activity: Yes

## 2019-02-25 LAB — CBC WITH DIFFERENTIAL/PLATELET
Absolute Monocytes: 934 cells/uL (ref 200–950)
Basophils Absolute: 87 cells/uL (ref 0–200)
Basophils Relative: 0.5 %
Eosinophils Absolute: 138 cells/uL (ref 15–500)
Eosinophils Relative: 0.8 %
HCT: 51.9 % — ABNORMAL HIGH (ref 38.5–50.0)
Hemoglobin: 17.8 g/dL — ABNORMAL HIGH (ref 13.2–17.1)
Lymphs Abs: 2007 cells/uL (ref 850–3900)
MCH: 30.5 pg (ref 27.0–33.0)
MCHC: 34.3 g/dL (ref 32.0–36.0)
MCV: 89 fL (ref 80.0–100.0)
MPV: 11 fL (ref 7.5–12.5)
Monocytes Relative: 5.4 %
Neutro Abs: 14134 cells/uL — ABNORMAL HIGH (ref 1500–7800)
Neutrophils Relative %: 81.7 %
Platelets: 363 10*3/uL (ref 140–400)
RBC: 5.83 10*6/uL — ABNORMAL HIGH (ref 4.20–5.80)
RDW: 13 % (ref 11.0–15.0)
Total Lymphocyte: 11.6 %
WBC: 17.3 10*3/uL — ABNORMAL HIGH (ref 3.8–10.8)

## 2019-02-25 LAB — C-REACTIVE PROTEIN: CRP: 39 mg/L — ABNORMAL HIGH (ref <8.0)

## 2019-02-25 LAB — URIC ACID: Uric Acid, Serum: 8.2 mg/dL — ABNORMAL HIGH (ref 4.0–8.0)

## 2019-02-25 LAB — SEDIMENTATION RATE: Sed Rate: 28 mm/h — ABNORMAL HIGH (ref 0–15)

## 2019-02-25 NOTE — Progress Notes (Signed)
Does this sound more like gout to you?  I know his uric acid is elevated, and that is likely what it is, but I was slightly concerned for infection also

## 2019-02-26 NOTE — Progress Notes (Signed)
Ok.  He didn't really have any effusion so I didn't try.

## 2019-02-26 NOTE — Progress Notes (Signed)
Likely gout.  Were you able to aspirate the knee?

## 2019-02-26 NOTE — Progress Notes (Signed)
Ok.  Could consider having hilts look at it under ultrasound to see if there's something to aspirate.  What are you trying to rule in or out with the MRI?

## 2019-03-01 NOTE — Progress Notes (Signed)
Can you patient know likely gout, but cannot rule out infection yet.  Can you also see if we can get him into see hilts asap to see if anything to aspirate and send off?

## 2019-03-01 NOTE — Progress Notes (Signed)
I was thinking for infection/osteo since there was nothing to aspirate.  I sent a message to liz seeing if hilts can get him in asap for [place]

## 2019-03-02 ENCOUNTER — Encounter: Payer: Self-pay | Admitting: Family Medicine

## 2019-03-02 ENCOUNTER — Ambulatory Visit (INDEPENDENT_AMBULATORY_CARE_PROVIDER_SITE_OTHER): Payer: 59 | Admitting: Family Medicine

## 2019-03-02 ENCOUNTER — Ambulatory Visit: Payer: Self-pay

## 2019-03-02 ENCOUNTER — Other Ambulatory Visit: Payer: Self-pay

## 2019-03-02 DIAGNOSIS — M25562 Pain in left knee: Secondary | ICD-10-CM

## 2019-03-02 NOTE — Progress Notes (Signed)
   Office Visit Note   Patient: Derrick Barr           Date of Birth: 1970/12/21           MRN: [id number] Visit Date: 03/02/2019 Requested by: No referring provider defined for this encounter. PCP: Patient, [name]  Subjective: Chief Complaint  Patient presents with  . Left Knee - Pain    Aspiration under US guidance    HPI: He is here for left knee aspiration under ultrasound.  Inflammatory markers were elevated, uric acid as well.  He does have a history of gout since he was in his 71s.  Severe pain in his left knee.  Using crutches and a knee brace.              ROS: No fevers or chills.  All other systems were reviewed and are negative.  Objective: Vital Signs: There were no vitals taken for this visit.  Physical Exam:  General:  Alert and oriented, in no acute distress. Pulm:  Breathing unlabored. Psy:  Normal mood, congruent affect. Skin: No erythema. Left knee: 2+ effusion today with no warmth.  Very limited range of motion.  Imaging: Limited diagnostic ultrasound reveals a moderate sized effusion with no hyperemia on power Doppler imaging.  Assessment & Plan: 1.  Left knee effusion, possible gout versus infection. -We will aspirate his knee today.  We will send the fluid for crystal analysis, cell count and differential, culture. -If negative for signs of infection, could inject with cortisone.     Procedures: Left knee aspiration: After sterile prep with Betadine, injected 5 cc 1% lidocaine without epinephrine, then aspirated 30 cc hazy yellow synovial fluid from superolateral approach using ultrasound guidance.    PMFS History: Patient Active Problem List   Diagnosis Date Noted  . Depression (emotion) 01/01/2015   Past Medical History:  Diagnosis Date  . Gout     History reviewed. No pertinent family history.  History reviewed. No pertinent surgical history. Social History   Occupational History  . Not on file  Tobacco Use  . Smoking  status: Never Smoker  . Smokeless tobacco: Never Used  Substance and Sexual Activity  . Alcohol use: No    Alcohol/week: 0.0 standard drinks  . Drug use: No  . Sexual activity: Yes

## 2019-03-03 ENCOUNTER — Telehealth: Payer: Self-pay | Admitting: Orthopaedic Surgery

## 2019-03-03 NOTE — Telephone Encounter (Signed)
Patient called and stated that he had fluid drawn off his knee on 03/02/19 and was told Xu's office will have results from the fluid that was sent off to be tested. Wanted to know are there any results. Please call patient @ [phone]

## 2019-03-03 NOTE — Telephone Encounter (Signed)
I haven't seen any results yet.

## 2019-03-03 NOTE — Telephone Encounter (Signed)
Do we have any results back yet? See message below. Thank you.

## 2019-03-03 NOTE — Telephone Encounter (Signed)
Called patient. Advised him still pending.

## 2019-03-04 NOTE — Telephone Encounter (Signed)
Any updates on results?

## 2019-03-05 NOTE — Telephone Encounter (Signed)
Thank you :)

## 2019-03-05 NOTE — Telephone Encounter (Signed)
I checked the [facility]): results are still pending.

## 2019-03-09 ENCOUNTER — Telehealth: Payer: Self-pay | Admitting: Family Medicine

## 2019-03-09 LAB — ANAEROBIC AND AEROBIC CULTURE
AER RESULT:: NO GROWTH
GRAM STAIN:: NONE SEEN
MICRO NUMBER:: 10132462
MICRO NUMBER:: 10132463
SPECIMEN QUALITY:: ADEQUATE
SPECIMEN QUALITY:: ADEQUATE

## 2019-03-09 LAB — SYNOVIAL CELL COUNT + DIFF, W/ CRYSTALS
Basophils, %: 0 %
Eosinophils-Synovial: 0 % (ref 0–2)
Lymphocytes-Synovial Fld: 6 % (ref 0–74)
Monocyte/Macrophage: 4 % (ref 0–69)
Neutrophil, Synovial: 90 % — ABNORMAL HIGH (ref 0–24)
Synoviocytes, %: 0 % (ref 0–15)
WBC, Synovial: 4000 cells/uL — ABNORMAL HIGH (ref <150)

## 2019-03-09 NOTE — Telephone Encounter (Signed)
Knee fluid showed gout crystals.  If still in pain, we can inject with cortisone.  No sign of infection.

## 2019-03-09 NOTE — Progress Notes (Signed)
Can we cancel mri for tomorrow since hilts was able to aspirate?    Also, can you call patient and let him know this is gout and to come in for cortisone inj

## 2019-03-09 NOTE — Telephone Encounter (Signed)
I called the patient and advised him of the results. He said the pain has eased up some, but the knee still tries to lock up. His MRI is tomorrow morning, with a follow up appointment with Dr. Roda Shutters on Friday the 19th.

## 2019-03-10 ENCOUNTER — Ambulatory Visit (INDEPENDENT_AMBULATORY_CARE_PROVIDER_SITE_OTHER): Payer: 59 | Admitting: Orthopaedic Surgery

## 2019-03-10 ENCOUNTER — Other Ambulatory Visit: Payer: 59

## 2019-03-10 ENCOUNTER — Other Ambulatory Visit: Payer: Self-pay

## 2019-03-10 DIAGNOSIS — M10062 Idiopathic gout, left knee: Secondary | ICD-10-CM | POA: Diagnosis not present

## 2019-03-10 MED ORDER — BUPIVACAINE HCL 0.5 % IJ SOLN
2.0000 mL | INTRAMUSCULAR | Status: AC | PRN
  Administered 2019-03-10: 2 mL via INTRA_ARTICULAR

## 2019-03-10 MED ORDER — METHYLPREDNISOLONE ACETATE 40 MG/ML IJ SUSP
40.0000 mg | INTRAMUSCULAR | Status: AC | PRN
  Administered 2019-03-10: 40 mg via INTRA_ARTICULAR

## 2019-03-10 MED ORDER — LIDOCAINE HCL 1 % IJ SOLN
2.0000 mL | INTRAMUSCULAR | Status: AC | PRN
  Administered 2019-03-10: 2 mL

## 2019-03-10 NOTE — Progress Notes (Signed)
   Office Visit Note   Patient: Derrick Barr           Date of Birth: January 04, 1971           MRN: [id number] Visit Date: 03/10/2019              Requested by: No referring provider defined for this encounter. PCP: Patient, [name]   Assessment & Plan: Visit Diagnoses:  1. Acute idiopathic gout of left knee     Plan: I aspirated another 45 cc Alexander from the left knee and injected cortisone as well.  Patient tolerated this well.  Patient will evaluate his diet.  He will also consult with his PCP on better gout management.  Follow-up as needed.  Follow-Up Instructions: Return if symptoms worsen or fail to improve.   Orders:  No orders of the defined types were placed in this encounter.  No orders of the defined types were placed in this encounter.     Procedures: Large Joint Inj: L knee on 03/10/2019 2:59 PM Details: 22 G needle Medications: 2 mL bupivacaine 0.5 %; 2 mL lidocaine 1 %; 40 mg methylPREDNISolone acetate 40 MG/ML Aspirate: 45 mL blood-tinged Outcome: tolerated well, no immediate complications Patient was prepped and draped in the usual sterile fashion.       Clinical Data: No additional findings.   Subjective: Chief Complaint  Patient presents with  . Left Knee - Pain    Jourdain is today for his left knee gout.  His effusion has recurred.  Denies any constitutional symptoms.   Review of Systems   Objective: Vital Signs: There were no vitals taken for this visit.  Physical Exam  Ortho Exam Left knee exam shows a large joint effusion.  No signs of infection.  Limited range of motion secondary to effusion and pain. Specialty Comments:  No specialty comments available.  Imaging: No results found.   PMFS History: Patient Active Problem List   Diagnosis Date Noted  . Acute idiopathic gout of left knee 03/10/2019  . Depression (emotion) 01/01/2015   Past Medical History:  Diagnosis Date  . Gout     No family history on file.  No  past surgical history on file. Social History   Occupational History  . Not on file  Tobacco Use  . Smoking status: Never Smoker  . Smokeless tobacco: Never Used  Substance and Sexual Activity  . Alcohol use: No    Alcohol/week: 0.0 standard drinks  . Drug use: No  . Sexual activity: Yes

## 2019-03-12 ENCOUNTER — Ambulatory Visit: Payer: 59 | Admitting: Orthopaedic Surgery

## 2019-03-19 ENCOUNTER — Encounter: Payer: Self-pay | Admitting: Registered Nurse

## 2019-03-19 ENCOUNTER — Telehealth: Payer: Self-pay | Admitting: General Practice

## 2019-03-19 ENCOUNTER — Ambulatory Visit: Payer: 59 | Admitting: Registered Nurse

## 2019-03-19 ENCOUNTER — Other Ambulatory Visit: Payer: Self-pay

## 2019-03-19 VITALS — BP 164/100 | HR 79 | Temp 97.6°F | Resp 16 | Ht 71.0 in | Wt 230.0 lb

## 2019-03-19 DIAGNOSIS — R109 Unspecified abdominal pain: Secondary | ICD-10-CM | POA: Diagnosis not present

## 2019-03-19 LAB — POCT URINALYSIS DIP (CLINITEK)
Bilirubin, UA: NEGATIVE
Glucose, UA: NEGATIVE mg/dL
Ketones, POC UA: NEGATIVE mg/dL
Leukocytes, UA: NEGATIVE
Nitrite, UA: NEGATIVE
Spec Grav, UA: >=1.03 — AB (ref 1.010–1.025)
Urobilinogen, UA: 0.2 E.U./dL
pH, UA: 5.5 (ref 5.0–8.0)

## 2019-03-19 MED ORDER — HYDROCODONE-ACETAMINOPHEN 10-325 MG PO TABS: 1.0000 | ORAL_TABLET | Freq: Three times a day (TID) | ORAL | 0 refills | Status: AC | PRN

## 2019-03-19 MED ORDER — TAMSULOSIN HCL 0.4 MG PO CAPS: 0.4000 mg | ORAL_CAPSULE | Freq: Every day | ORAL | 0 refills | Status: DC

## 2019-03-19 MED ORDER — OXYCODONE HCL 7.5 MG PO TABS: 7.5000 mg | ORAL_TABLET | Freq: Three times a day (TID) | ORAL | 0 refills | Status: DC | PRN

## 2019-03-19 NOTE — Telephone Encounter (Signed)
Provider has been notified.  

## 2019-03-19 NOTE — Telephone Encounter (Signed)
Pt called and said that if there is another medication that could be prescribed because [facility] told him they do not carry that medication .   Please advise

## 2019-03-19 NOTE — Patient Instructions (Signed)
° ° ° °  If you have lab work done today you will be contacted with your lab results within the next 2 weeks.  If you have not heard from us then please contact us. The fastest way to get your results is to register for My Chart. ° ° °IF you received an x-ray today, you will receive an invoice from [facility]. Please contact [facility] at [phone] with questions or concerns regarding your invoice.  ° °IF you received labwork today, you will receive an invoice from [facility]. Please contact [facility] at [phone] with questions or concerns regarding your invoice.  ° °Our billing staff will not be able to assist you with questions regarding bills from these companies. ° °You will be contacted with the lab results as soon as they are available. The fastest way to get your results is to activate your My Chart account. Instructions are located on the last page of this paperwork. If you have not heard from us regarding the results in 2 weeks, please contact this office. °  ° ° ° °

## 2019-03-19 NOTE — Telephone Encounter (Signed)
Pt is making sure his oxyCODONE HCl 7.5 MG TABS  Gets sent in from his appt today  What is the name of the medication?  Have you contacted your pharmacy to request a refill? y  Which pharmacy would you like this sent to?  [facility] on [address] [facility]    Patient notified that their request is being sent to the clinical staff for review and that they should receive a call once it is complete. If they do not receive a call within 72 hours they can check with their pharmacy or our office.

## 2019-03-20 LAB — COMPREHENSIVE METABOLIC PANEL
ALT: 30 IU/L (ref 0–44)
AST: 29 IU/L (ref 0–40)
Albumin/Globulin Ratio: 1.5 (ref 1.2–2.2)
Albumin: 4.8 g/dL (ref 4.0–5.0)
Alkaline Phosphatase: 91 IU/L (ref 39–117)
BUN/Creatinine Ratio: 8 — ABNORMAL LOW (ref 9–20)
BUN: 13 mg/dL (ref 6–24)
Bilirubin Total: 0.5 mg/dL (ref 0.0–1.2)
CO2: 21 mmol/L (ref 20–29)
Calcium: 10.5 mg/dL — ABNORMAL HIGH (ref 8.7–10.2)
Chloride: 102 mmol/L (ref 96–106)
Creatinine, Ser: 1.58 mg/dL — ABNORMAL HIGH (ref 0.76–1.27)
GFR calc Af Amer: 59 mL/min/{1.73_m2} — ABNORMAL LOW (ref >59)
GFR calc non Af Amer: 51 mL/min/{1.73_m2} — ABNORMAL LOW (ref >59)
Globulin, Total: 3.2 g/dL (ref 1.5–4.5)
Glucose: 119 mg/dL — ABNORMAL HIGH (ref 65–99)
Potassium: 4.6 mmol/L (ref 3.5–5.2)
Sodium: 142 mmol/L (ref 134–144)
Total Protein: 8 g/dL (ref 6.0–8.5)

## 2019-03-20 LAB — CBC WITH DIFFERENTIAL/PLATELET
Basophils Absolute: 0.1 10*3/uL (ref 0.0–0.2)
Basos: 0 %
EOS (ABSOLUTE): 0.1 10*3/uL (ref 0.0–0.4)
Eos: 0 %
Hematocrit: 51 % (ref 37.5–51.0)
Hemoglobin: 17.6 g/dL (ref 13.0–17.7)
Immature Grans (Abs): 0.1 10*3/uL (ref 0.0–0.1)
Immature Granulocytes: 1 %
Lymphocytes Absolute: 2.1 10*3/uL (ref 0.7–3.1)
Lymphs: 10 %
MCH: 30.8 pg (ref 26.6–33.0)
MCHC: 34.5 g/dL (ref 31.5–35.7)
MCV: 89 fL (ref 79–97)
Monocytes Absolute: 1 10*3/uL — ABNORMAL HIGH (ref 0.1–0.9)
Monocytes: 5 %
Neutrophils Absolute: 16.6 10*3/uL — ABNORMAL HIGH (ref 1.4–7.0)
Neutrophils: 84 %
Platelets: 386 10*3/uL (ref 150–450)
RBC: 5.71 x10E6/uL (ref 4.14–5.80)
RDW: 13.1 % (ref 11.6–15.4)
WBC: 19.9 10*3/uL — ABNORMAL HIGH (ref 3.4–10.8)

## 2019-03-22 ENCOUNTER — Telehealth: Payer: Self-pay | Admitting: Registered Nurse

## 2019-03-22 NOTE — Telephone Encounter (Signed)
Called patient to discuss renal stone Pain medication is helping Flomax is helping Feels like stone has moved from kidney, much lower now, still painful Continuing to hydrate WBCs very elevated - he denies fever, chills, fatigue, and other systemic symptoms We will continue to monitor this Pt agrees with plan  Jari Sportsman, NP

## 2019-03-23 ENCOUNTER — Other Ambulatory Visit: Payer: Self-pay | Admitting: Registered Nurse

## 2019-03-23 ENCOUNTER — Telehealth: Payer: Self-pay | Admitting: General Practice

## 2019-03-23 ENCOUNTER — Telehealth: Payer: Self-pay | Admitting: Registered Nurse

## 2019-03-23 DIAGNOSIS — R112 Nausea with vomiting, unspecified: Secondary | ICD-10-CM

## 2019-03-23 DIAGNOSIS — R109 Unspecified abdominal pain: Secondary | ICD-10-CM

## 2019-03-23 DIAGNOSIS — R10A0 Flank pain, unspecified side: Secondary | ICD-10-CM

## 2019-03-23 MED ORDER — OXYCODONE-ACETAMINOPHEN 7.5-325 MG PO TABS: 1.0000 | ORAL_TABLET | ORAL | 0 refills | Status: DC | PRN

## 2019-03-23 MED ORDER — OXYCODONE HCL 7.5 MG PO TABS: 7.5000 mg | ORAL_TABLET | Freq: Three times a day (TID) | ORAL | 0 refills | Status: DC | PRN

## 2019-03-23 MED ORDER — ONDANSETRON HCL 4 MG PO TABS: 4.0000 mg | ORAL_TABLET | Freq: Four times a day (QID) | ORAL | 0 refills | Status: DC

## 2019-03-23 MED ORDER — ONDANSETRON HCL 4 MG PO TABS: 4.0000 mg | ORAL_TABLET | Freq: Three times a day (TID) | ORAL | 0 refills | Status: DC | PRN

## 2019-03-23 NOTE — Telephone Encounter (Signed)
[facility] is calling regarding oxyCODONE HCl 7.5 MG TABS [ This does not come in 7.5 he can put in new order for  in 7.5 325 or 5 MG  Depends on the dose for patient ,  [facility] [address], [place] - [address]  [address], [place] [address]  Phone:  [phone] Fax:  [phone]

## 2019-03-23 NOTE — Telephone Encounter (Signed)
Pharmacy is calling back / patient called pharmacy in pain pharmacy askingthat we send new order for patent

## 2019-03-23 NOTE — Telephone Encounter (Signed)
Patient is calling back regarding his request for his pain medication.  Patient stated that he is in a lot of pain and pharmacy still does not have his medication.  Please advise and send as soon as possible.

## 2019-03-23 NOTE — Telephone Encounter (Signed)
[facility] didn't have the medication that was sent over patient now have the proper medication sent to the pharmacy.

## 2019-03-23 NOTE — Telephone Encounter (Signed)
What is the name of the medication? HYDROcodone-acetaminophen (NORCO) 10-325 MG tablet      Have you contacted your pharmacy to request a refill? No   Which pharmacy would you like this sent to? [facility] [address], [place] - [address]  [address] [place] [address]  Phone:  [phone] Fax:  [phone]    Patient notified that their request is being sent to the clinical staff for review and that they should receive a call once it is complete. If they do not receive a call within 72 hours they can check with their pharmacy or our office.    Patient is still in a lot of pain a fever of 103 still has not passed a stone pcp

## 2019-03-26 ENCOUNTER — Other Ambulatory Visit: Payer: Self-pay

## 2019-03-26 ENCOUNTER — Encounter (HOSPITAL_COMMUNITY): Payer: Self-pay | Admitting: *Deleted

## 2019-03-26 ENCOUNTER — Emergency Department (HOSPITAL_COMMUNITY)
Admission: EM | Admit: 2019-03-26 | Discharge: 2019-03-26 | Disposition: A | Discharge status: Home / Self Care | Payer: 59 | Attending: Emergency Medicine | Admitting: Emergency Medicine

## 2019-03-26 ENCOUNTER — Emergency Department (HOSPITAL_COMMUNITY): Payer: 59

## 2019-03-26 DIAGNOSIS — Z79899 Other long term (current) drug therapy: Secondary | ICD-10-CM | POA: Insufficient documentation

## 2019-03-26 DIAGNOSIS — R1032 Left lower quadrant pain: Secondary | ICD-10-CM | POA: Diagnosis present

## 2019-03-26 DIAGNOSIS — N134 Hydroureter: Secondary | ICD-10-CM | POA: Insufficient documentation

## 2019-03-26 DIAGNOSIS — N130 Hydronephrosis with ureteropelvic junction obstruction: Secondary | ICD-10-CM | POA: Insufficient documentation

## 2019-03-26 DIAGNOSIS — N201 Calculus of ureter: Secondary | ICD-10-CM | POA: Diagnosis not present

## 2019-03-26 DIAGNOSIS — R110 Nausea: Secondary | ICD-10-CM | POA: Diagnosis not present

## 2019-03-26 LAB — COMPREHENSIVE METABOLIC PANEL
ALT: 68 U/L — ABNORMAL HIGH (ref 0–44)
AST: 38 U/L (ref 15–41)
Albumin: 3.5 g/dL (ref 3.5–5.0)
Alkaline Phosphatase: 167 U/L — ABNORMAL HIGH (ref 38–126)
Anion gap: 12 (ref 5–15)
BUN: 17 mg/dL (ref 6–20)
CO2: 24 mmol/L (ref 22–32)
Calcium: 9.3 mg/dL (ref 8.9–10.3)
Chloride: 100 mmol/L (ref 98–111)
Creatinine, Ser: 2 mg/dL — ABNORMAL HIGH (ref 0.61–1.24)
GFR calc Af Amer: 44 mL/min — ABNORMAL LOW (ref >60)
GFR calc non Af Amer: 38 mL/min — ABNORMAL LOW (ref >60)
Glucose, Bld: 163 mg/dL — ABNORMAL HIGH (ref 70–99)
Potassium: 4.1 mmol/L (ref 3.5–5.1)
Sodium: 136 mmol/L (ref 135–145)
Total Bilirubin: 1.1 mg/dL (ref 0.3–1.2)
Total Protein: 7.6 g/dL (ref 6.5–8.1)

## 2019-03-26 LAB — URINALYSIS, ROUTINE W REFLEX MICROSCOPIC
Bilirubin Urine: NEGATIVE
Glucose, UA: NEGATIVE mg/dL
Ketones, ur: NEGATIVE mg/dL
Nitrite: NEGATIVE
Protein, ur: NEGATIVE mg/dL
Specific Gravity, Urine: 1.025 (ref 1.005–1.030)
pH: 5 (ref 5.0–8.0)

## 2019-03-26 LAB — CBC
HCT: 46.6 % (ref 39.0–52.0)
Hemoglobin: 15.4 g/dL (ref 13.0–17.0)
MCH: 30.6 pg (ref 26.0–34.0)
MCHC: 33 g/dL (ref 30.0–36.0)
MCV: 92.5 fL (ref 80.0–100.0)
Platelets: 373 10*3/uL (ref 150–400)
RBC: 5.04 MIL/uL (ref 4.22–5.81)
RDW: 12.9 % (ref 11.5–15.5)
WBC: 13.3 10*3/uL — ABNORMAL HIGH (ref 4.0–10.5)
nRBC: 0 % (ref 0.0–0.2)

## 2019-03-26 LAB — LIPASE, BLOOD: Lipase: 23 U/L (ref 11–51)

## 2019-03-26 MED ORDER — SODIUM CHLORIDE 0.9% FLUSH: 3.0000 mL | Freq: Once | INTRAVENOUS | Status: DC

## 2019-03-26 MED ORDER — MORPHINE SULFATE (PF) 4 MG/ML IV SOLN
4.0000 mg | Freq: Once | INTRAVENOUS | Status: AC
  Administered 2019-03-26: 4 mg via INTRAVENOUS
  Filled 2019-03-26: qty 1

## 2019-03-26 MED ORDER — ONDANSETRON 4 MG PO TBDP: 4.0000 mg | ORAL_TABLET | Freq: Three times a day (TID) | ORAL | 0 refills | Status: DC | PRN

## 2019-03-26 MED ORDER — TAMSULOSIN HCL 0.4 MG PO CAPS: 0.4000 mg | ORAL_CAPSULE | Freq: Every day | ORAL | 0 refills | Status: AC

## 2019-03-26 MED ORDER — SODIUM CHLORIDE 0.9 % IV SOLN
Freq: Once | INTRAVENOUS | Status: AC
  Administered 2019-03-26: 1000 mL via INTRAVENOUS

## 2019-03-26 MED ORDER — ONDANSETRON HCL 4 MG/2ML IJ SOLN
4.0000 mg | Freq: Once | INTRAMUSCULAR | Status: AC
  Administered 2019-03-26: 4 mg via INTRAVENOUS
  Filled 2019-03-26: qty 2

## 2019-03-26 NOTE — ED Notes (Signed)
Pt tolerated PO Intake well. 

## 2019-03-26 NOTE — ED Notes (Signed)
Patient verbalizes understanding of discharge instructions. Opportunity for questioning and answers were provided. Armband removed by staff, pt discharged from ED ambulatory to home.  

## 2019-03-26 NOTE — ED Triage Notes (Signed)
States he went to Urgent care last last Friday and was treated for kidney stones ans was told if he had any further problems to come to ED

## 2019-03-26 NOTE — Discharge Instructions (Addendum)
Please call [facility] to make an appointment.  It may take several days to a week for the stone to pass.  Please use oxycodone more frequently to control the pain.  Please use Zofran as needed for nausea I have prescribed you some additional Zofran which you may take up as needed.  Please drink plenty of water.  I also have prescribed you some Flomax which may help you pass the stone.  You may stop taking Flomax after the stone is passed.

## 2019-03-26 NOTE — ED Provider Notes (Signed)
[facility]ER[facility]ENC[facility] DEPARTMENT Provider Note   CSN: [id number] Arrival date & time: 03/26/19  7035     History Chief Complaint  Patient presents with  . Abdominal Pain  . Back Pain    [facility] is a 49 y.o. male.  The history is provided by the patient.  Flank Pain This is a new problem. The current episode started 2 days ago. The problem occurs constantly. The problem has not changed since onset.Associated symptoms include abdominal pain. Pertinent negatives include no chest pain, no headaches and no shortness of breath. The symptoms are aggravated by standing and walking. The symptoms are relieved by narcotics and rest.   Patient is a 49 year old male with no significant past medical history apart from history of gout and kidney stones 3 years ago patient was seen in urgent care and given Percocet, Zofran and Flomax several days ago but has been having worsening pain since.  He was told that his emergency department visit was uncontrolled.  He states he has not been taking the Percocet every 6 hours but has been using it "as needed "and has not taken any today.  Patient states that he has nausea but has not vomited.  Denies any diarrhea denies any severe alcohol use a biliary disease history and states that the pain radiates to his left pelvis.  He states it is sharp and stabbing, constant, worse with walking and better with Percocet.  He states he has not been able to eat or drink very much today.  He denies any fevers, chills, urinary symptoms, changes of bowel movements.  Denies any chest pain shortness of breath headache or dizziness.    Past Medical History:  Diagnosis Date  . Gout     Patient Active Problem List   Diagnosis Date Noted  . Acute idiopathic gout of left knee 03/10/2019  . Depression (emotion) 01/01/2015    History reviewed. No pertinent surgical history.     No family history on file.  Social History   Tobacco Use  .  Smoking status: Never Smoker  . Smokeless tobacco: Never Used  Substance Use Topics  . Alcohol use: No    Alcohol/week: 0.0 standard drinks  . Drug use: No    Home Medications Prior to Admission medications   Medication Sig Start Date End Date Taking? Authorizing Provider  allopurinol (ZYLOPRIM) 100 MG tablet TAKE 1 TABLET TWICE A DAY Patient taking differently: Take 100 mg by mouth 2 (two) times daily.  08/15/14  Yes Chrismon, Vickki Muff, PA  ondansetron (ZOFRAN) 4 MG tablet Take 1 tablet (4 mg total) by mouth every 8 (eight) hours as needed for nausea or vomiting. 03/23/19  Yes Maximiano Coss, NP  oxyCODONE-acetaminophen (PERCOCET) 7.5-325 MG tablet Take 1 tablet by mouth every 4 (four) hours as needed for severe pain. 03/23/19  Yes Maximiano Coss, NP  ondansetron (ZOFRAN ODT) 4 MG disintegrating tablet Take 1 tablet (4 mg total) by mouth every 8 (eight) hours as needed for nausea or vomiting. 03/26/19   Tedd Sias, PA  oxyCODONE HCl 7.5 MG TABS Take 7.5 mg by mouth 3 (three) times daily as needed. Patient not taking: Reported on 03/26/2019 03/23/19   Maximiano Coss, NP  tamsulosin (FLOMAX) 0.4 MG CAPS capsule Take 1 capsule (0.4 mg total) by mouth daily after breakfast for 14 days. 03/26/19 04/09/19  Tedd Sias, PA    Allergies    Patient has no known allergies.  Review of Systems  Review of Systems  Constitutional: Negative for chills and fever.  HENT: Negative for congestion.   Eyes: Negative for pain.  Respiratory: Negative for cough and shortness of breath.   Cardiovascular: Negative for chest pain and leg swelling.  Gastrointestinal: Positive for abdominal pain and nausea. Negative for vomiting.  Genitourinary: Positive for flank pain. Negative for dysuria.  Musculoskeletal: Negative for myalgias.  Skin: Negative for rash.  Neurological: Negative for dizziness and headaches.    Physical Exam Updated Vital Signs BP (!) 147/92 (BP Location: Right Arm)   Pulse 80   Temp  98.7 F (37.1 C) (Oral)   Resp 15   SpO2 96%   Physical Exam Vitals and nursing note reviewed.  Constitutional:      General: He is in acute distress.     Comments: Patient appears acutely uncomfortable.  Pleasant, 49 year old man --does not appear toxic or diaphoretic.  HENT:     Head: Normocephalic and atraumatic.     Nose: Nose normal.  Eyes:     General: No scleral icterus. Cardiovascular:     Rate and Rhythm: Normal rate and regular rhythm.     Pulses: Normal pulses.     Heart sounds: Normal heart sounds.  Pulmonary:     Effort: Pulmonary effort is normal. No respiratory distress.     Breath sounds: No wheezing.  Abdominal:     Palpations: Abdomen is soft. There is no mass.     Tenderness: There is abdominal tenderness. There is no right CVA tenderness, left CVA tenderness or guarding.     Hernia: No hernia is present.     Comments: Abdominal exam with mild tenderness to palpation of the left lower abdomen just lateral of bladder.  No palpable masses.  No CVA tenderness.  Genitourinary:    Penis: Normal.      Testes: Normal. Cremasteric reflex is present.     Comments: Testes are symmetric with normal lie, normal cremasteric reflex no tenderness to palpation.  No crepitus. Musculoskeletal:     Cervical back: Normal range of motion.     Right lower leg: No edema.     Left lower leg: No edema.  Skin:    General: Skin is warm and dry.     Capillary Refill: Capillary refill takes less than 2 seconds.  Neurological:     Mental Status: He is alert. Mental status is at baseline.  Psychiatric:        Mood and Affect: Mood normal.        Behavior: Behavior normal.     ED Results / Procedures / Treatments   Labs (all labs ordered are listed, but only abnormal results are displayed) Labs Reviewed  COMPREHENSIVE METABOLIC PANEL - Abnormal; Notable for the following components:      Result Value   Glucose, Bld 163 (*)    Creatinine, Ser 2.00 (*)    ALT 68 (*)     Alkaline Phosphatase 167 (*)    GFR calc non Af Amer 38 (*)    GFR calc Af Amer 44 (*)    All other components within normal limits  CBC - Abnormal; Notable for the following components:   WBC 13.3 (*)    All other components within normal limits  URINALYSIS, ROUTINE W REFLEX MICROSCOPIC - Abnormal; Notable for the following components:   Hgb urine dipstick SMALL (*)    Leukocytes,Ua TRACE (*)    Bacteria, UA FEW (*)    All other components within normal limits  LIPASE, BLOOD    EKG None  Radiology CT ABDOMEN PELVIS WO CONTRAST  Result Date: 03/26/2019 CLINICAL DATA:  Flank pain EXAM: CT ABDOMEN AND PELVIS WITHOUT CONTRAST TECHNIQUE: Multidetector CT imaging of the abdomen and pelvis was performed following the standard protocol without IV contrast. COMPARISON:  11/26/2015 FINDINGS: Lower chest: No acute abnormality. Hepatobiliary: No focal liver abnormality is seen. No gallstones, gallbladder wall thickening, or biliary dilatation. Pancreas: Unremarkable. No pancreatic ductal dilatation or surrounding inflammatory changes. Spleen: Normal in size without focal abnormality. Adrenals/Urinary Tract: Unremarkable adrenal glands. 6 x 5 mm obstructing calculus at the left ureterovesical junction (series 3, image 85) resulting in mild left hydroureteronephrosis. There are additional punctate left renal calculi measuring up to 3 mm. Multiple nonobstructing right renal calculi, largest measuring up to 7 mm. Large simple superior pole right renal cyst measuring up to 9.2 cm. Urinary bladder is incompletely distended without focal abnormality. Stomach/Bowel: Stomach is within normal limits. Appendix appears normal. Scattered colonic diverticulosis. No evidence of bowel wall thickening, distention, or inflammatory changes. Vascular/Lymphatic: No significant vascular findings are present. No enlarged abdominal or pelvic lymph nodes. Reproductive: Prostate is unremarkable. Other: No abdominal wall hernia or  abnormality. No abdominopelvic ascites. Musculoskeletal: No acute or significant osseous findings. IMPRESSION: 1. Obstructing 6 x 5 mm calculus at the left UVJ resulting in mild left hydroureteronephrosis. 2. Additional bilateral nonobstructing renal calculi, largest measuring up to 7 mm on the right. 3. Large simple superior pole right renal cyst measuring up to 9.2 cm. 4. Colonic diverticulosis without evidence of diverticulitis. Electronically Signed   By: Davina Poke D.O.   On: 03/26/2019 14:19    Procedures Procedures (including critical care time)  Medications Ordered in ED Medications  morphine 4 MG/ML injection 4 mg (4 mg Intravenous Given 03/26/19 1314)  ondansetron (ZOFRAN) injection 4 mg (4 mg Intravenous Given 03/26/19 1313)  0.9 %  sodium chloride infusion ( Intravenous Stopped 03/26/19 1521)    ED Course  I have reviewed the triage vital signs and the nursing notes.  Pertinent labs & imaging results that were available during my care of the patient were reviewed by me and considered in my medical decision making (see chart for details).  Clinical Course as of Mar 26 912  Fri Mar 26, 2019  1428 CT abdomen pelvis without contrast independently reviewed myself.  Agree with radiology read.  There is a left-sided UVJ stone.   IMPRESSION: 1. Obstructing 6 x 5 mm calculus at the left UVJ resulting in mild left hydroureteronephrosis. 2. Additional bilateral nonobstructing renal calculi, largest measuring up to 7 mm on the right. 3. Large simple superior pole right renal cyst measuring up to 9.2 cm. 4. Colonic diverticulosis without evidence of diverticulitis.   [WF]  4917 UA with no evidence of infection.  There is a small amount of blood consistent with patient's description of hematuria secondary likely to his UVJ stone.   [WF]  1441 CMP remarkable for mildly elevated creatinine.  He has some hydronephrosis of his left kidney he appears well compensated on CT scan with no  hydronephrosis of his right kidney.  No evidence of bilateral obstruction.  No evidence of infection.   [WF]  1442 Mild elevation of alk phos-likely acute phase reactant.  No abdominal pain on reassessment-bilirubin is within normal limits.   [WF]  1442 Mild, improving leukocytosis from last CBC several days ago.  No anemia.   [WF]    Clinical Course User Index [WF] Tedd Sias,  PA   MDM Rules/Calculators/A&P                     Reassessment patient feels completely better after Zofran, morphine, fluids.   Patient has taken all of his Flomax he was prescribed. Will represcribe Flomax, patient has plenty of Percocet at home, will recommend ibuprofen as well.  He does have mild creatinine bump however patient will benefit from anti-inflammatory effects of this medication.  Is also given more Zofran.  Patient given urology referral.  He will strain all urine and follow-up with urology early next week.  Final Clinical Impression(s) / ED Diagnoses Final diagnoses:  Left ureteral stone    Rx / DC Orders ED Discharge Orders         Ordered    tamsulosin (FLOMAX) 0.4 MG CAPS capsule  Daily after breakfast     03/26/19 1444    ondansetron (ZOFRAN ODT) 4 MG disintegrating tablet  Every 8 hours PRN     03/26/19 1445           Tedd Sias, [place] 03/27/19 0915    Davonna Belling, MD 03/27/19 1420

## 2019-03-26 NOTE — ED Provider Notes (Signed)
[facility] Provider Note   CSN: [id number] Arrival date & time: 03/26/19  2563     History Chief Complaint  Patient presents with  . Abdominal Pain  . Back Pain    ARDIT DANH is a 49 y.o. male.  HPI  Patient is a 49 year old male with no significant past medical history other than history of gout and history of kidney stones.  States his last kidney stone was 3 years ago.   Patient describes left lower pelvic/abdominal pain that is constant, worse with movement.  He states that it is sharp stabbing and has been ongoing for 3 days.  He states it feels similar in quality to his past kidney stone.  He states it radiates somewhat into his flank on the left side.  He denies any testicular pain, fevers, chills.  He does endorse nausea and vomiting which is nonbloody nonbilious.  He states that he has vomited several times in the past several days.  Is uncertain of exactly any time.  He denies any changes of his stool.  No melena or hematochezia no diarrhea or constipation.  He denies any concern for STDs.  Patient states he has had no abdominal surgeries in the past.  LBM this morning.     Past Medical History:  Diagnosis Date  . Gout     Patient Active Problem List   Diagnosis Date Noted  . Acute idiopathic gout of left knee 03/10/2019  . Depression (emotion) 01/01/2015    History reviewed. No pertinent surgical history.     No family history on file.  Social History   Tobacco Use  . Smoking status: Never Smoker  . Smokeless tobacco: Never Used  Substance Use Topics  . Alcohol use: No    Alcohol/week: 0.0 standard drinks  . Drug use: No    Home Medications Prior to Admission medications   Medication Sig Start Date End Date Taking? Authorizing Provider  allopurinol (ZYLOPRIM) 100 MG tablet TAKE 1 TABLET TWICE A DAY Patient taking differently: Take 100 mg by mouth 2 (two) times daily.  08/15/14  Yes Chrismon, Vickki Muff,  PA  ondansetron (ZOFRAN) 4 MG tablet Take 1 tablet (4 mg total) by mouth every 8 (eight) hours as needed for nausea or vomiting. 03/23/19  Yes Maximiano Coss, NP  oxyCODONE-acetaminophen (PERCOCET) 7.5-325 MG tablet Take 1 tablet by mouth every 4 (four) hours as needed for severe pain. 03/23/19  Yes Maximiano Coss, NP  ondansetron (ZOFRAN ODT) 4 MG disintegrating tablet Take 1 tablet (4 mg total) by mouth every 8 (eight) hours as needed for nausea or vomiting. 03/26/19   Tedd Sias, PA  oxyCODONE HCl 7.5 MG TABS Take 7.5 mg by mouth 3 (three) times daily as needed. Patient not taking: Reported on 03/26/2019 03/23/19   Maximiano Coss, NP  tamsulosin (FLOMAX) 0.4 MG CAPS capsule Take 1 capsule (0.4 mg total) by mouth daily after breakfast for 14 days. 03/26/19 04/09/19  Tedd Sias, PA    Allergies    Patient has no known allergies.  Review of Systems   Review of Systems  Constitutional: Negative for chills and fever.  HENT: Negative for congestion.   Eyes: Negative for pain.  Respiratory: Negative for cough and shortness of breath.   Cardiovascular: Negative for chest pain and leg swelling.  Gastrointestinal: Positive for abdominal pain, nausea and vomiting.  Genitourinary: Positive for hematuria. Negative for dysuria, flank pain, penile pain and urgency.  Musculoskeletal: Negative for  myalgias.  Skin: Negative for rash.  Neurological: Negative for dizziness and headaches.    Physical Exam Updated Vital Signs BP (!) 137/99 (BP Location: Right Arm)   Pulse 83   Temp 97.9 F (36.6 C) (Oral)   Resp 17   SpO2 98%   Physical Exam Vitals and nursing note reviewed.  Constitutional:      General: He is not in acute distress.    Comments: Patient appears uncomfortable  HENT:     Head: Normocephalic and atraumatic.     Nose: Nose normal.     Mouth/Throat:     Mouth: Mucous membranes are moist.  Eyes:     General: No scleral icterus. Cardiovascular:     Rate and Rhythm: Normal  rate and regular rhythm.     Pulses: Normal pulses.     Heart sounds: Normal heart sounds.  Pulmonary:     Effort: Pulmonary effort is normal. No respiratory distress.     Breath sounds: No wheezing.  Abdominal:     Palpations: Abdomen is soft.     Tenderness: There is no abdominal tenderness. There is no right CVA tenderness, left CVA tenderness, guarding or rebound.  Genitourinary:    Testes: Normal. Cremasteric reflex is present.        Right: Mass, tenderness or swelling not present.        Left: Mass, tenderness or swelling not present.     Comments: Testes with normal lie. No TTP.  Musculoskeletal:     Cervical back: Normal range of motion.     Right lower leg: No edema.     Left lower leg: No edema.  Skin:    General: Skin is warm and dry.     Capillary Refill: Capillary refill takes less than 2 seconds.  Neurological:     Mental Status: He is alert. Mental status is at baseline.  Psychiatric:        Mood and Affect: Mood normal.        Behavior: Behavior normal.     ED Results / Procedures / Treatments   Labs (all labs ordered are listed, but only abnormal results are displayed) Labs Reviewed  COMPREHENSIVE METABOLIC PANEL - Abnormal; Notable for the following components:      Result Value   Glucose, Bld 163 (*)    Creatinine, Ser 2.00 (*)    ALT 68 (*)    Alkaline Phosphatase 167 (*)    GFR calc non Af Amer 38 (*)    GFR calc Af Amer 44 (*)    All other components within normal limits  CBC - Abnormal; Notable for the following components:   WBC 13.3 (*)    All other components within normal limits  URINALYSIS, ROUTINE W REFLEX MICROSCOPIC - Abnormal; Notable for the following components:   Hgb urine dipstick SMALL (*)    Leukocytes,Ua TRACE (*)    Bacteria, UA FEW (*)    All other components within normal limits  LIPASE, BLOOD    EKG None  Radiology CT ABDOMEN PELVIS WO CONTRAST  Result Date: 03/26/2019 CLINICAL DATA:  Flank pain EXAM: CT ABDOMEN AND  PELVIS WITHOUT CONTRAST TECHNIQUE: Multidetector CT imaging of the abdomen and pelvis was performed following the standard protocol without IV contrast. COMPARISON:  11/26/2015 FINDINGS: Lower chest: No acute abnormality. Hepatobiliary: No focal liver abnormality is seen. No gallstones, gallbladder wall thickening, or biliary dilatation. Pancreas: Unremarkable. No pancreatic ductal dilatation or surrounding inflammatory changes. Spleen: Normal in size without focal  abnormality. Adrenals/Urinary Tract: Unremarkable adrenal glands. 6 x 5 mm obstructing calculus at the left ureterovesical junction (series 3, image 85) resulting in mild left hydroureteronephrosis. There are additional punctate left renal calculi measuring up to 3 mm. Multiple nonobstructing right renal calculi, largest measuring up to 7 mm. Large simple superior pole right renal cyst measuring up to 9.2 cm. Urinary bladder is incompletely distended without focal abnormality. Stomach/Bowel: Stomach is within normal limits. Appendix appears normal. Scattered colonic diverticulosis. No evidence of bowel wall thickening, distention, or inflammatory changes. Vascular/Lymphatic: No significant vascular findings are present. No enlarged abdominal or pelvic lymph nodes. Reproductive: Prostate is unremarkable. Other: No abdominal wall hernia or abnormality. No abdominopelvic ascites. Musculoskeletal: No acute or significant osseous findings. IMPRESSION: 1. Obstructing 6 x 5 mm calculus at the left UVJ resulting in mild left hydroureteronephrosis. 2. Additional bilateral nonobstructing renal calculi, largest measuring up to 7 mm on the right. 3. Large simple superior pole right renal cyst measuring up to 9.2 cm. 4. Colonic diverticulosis without evidence of diverticulitis. Electronically Signed   By: Davina Poke D.O.   On: 03/26/2019 14:19    Procedures Procedures (including critical care time)  Medications Ordered in ED Medications  sodium chloride  flush (NS) 0.9 % injection 3 mL (has no administration in time range)  morphine 4 MG/ML injection 4 mg (4 mg Intravenous Given 03/26/19 1314)  ondansetron (ZOFRAN) injection 4 mg (4 mg Intravenous Given 03/26/19 1313)  0.9 %  sodium chloride infusion (1,000 mLs Intravenous New Bag/Given 03/26/19 1316)    ED Course  I have reviewed the triage vital signs and the nursing notes.  Pertinent labs & imaging results that were available during my care of the patient were reviewed by me and considered in my medical decision making (see chart for details).  Clinical Course as of Mar 25 1445  Fri Mar 26, 2019  1428 CT abdomen pelvis without contrast independently reviewed myself.  Agree with radiology read.  There is a left-sided UVJ stone.   IMPRESSION: 1. Obstructing 6 x 5 mm calculus at the left UVJ resulting in mild left hydroureteronephrosis. 2. Additional bilateral nonobstructing renal calculi, largest measuring up to 7 mm on the right. 3. Large simple superior pole right renal cyst measuring up to 9.2 cm. 4. Colonic diverticulosis without evidence of diverticulitis.   [WF]  7106 UA with no evidence of infection.  There is a small amount of blood consistent with patient's description of hematuria secondary likely to his UVJ stone.   [WF]  1441 CMP remarkable for mildly elevated creatinine.  He has some hydronephrosis of his left kidney he appears well compensated on CT scan with no hydronephrosis of his right kidney.  No evidence of bilateral obstruction.  No evidence of infection.   [WF]  1442 Mild elevation of alk phos-likely acute phase reactant.  No abdominal pain on reassessment-bilirubin is within normal limits.   [WF]  1442 Mild, improving leukocytosis from last CBC several days ago.  No anemia.   [WF]    Clinical Course User Index [WF] Tedd Sias, [place]     Reexamination patient states his pain is at a 0.  He is tolerating p.o. without difficulty no nausea or vomiting at this  time.  I recommend the patient take his Percocets every 6 hours instead of "as needed "at least for the next several days.  I also referred him to urology to follow-up as he has a 5x6 mm calculus in his left UVJ.  He has not required intervention in the past however.    MDM Rules/Calculators/A&P                      I discussed this case with my attending physician who cosigned this note including patient's presenting symptoms, physical exam, and planned diagnostics and interventions. Attending physician stated agreement with plan or made changes to plan which were implemented.   The medical records were personally reviewed by myself. I personally reviewed all lab results and interpreted all imaging studies and either concurred with their official read or contacted radiology for clarification.   This patient appears reasonably screened and I doubt any other medical condition requiring further workup, evaluation, or treatment in the ED at this time prior to discharge.   Patient's vitals are WNL apart from vital sign abnormalities discussed above, patient is in NAD, and able to ambulate in the ED at their baseline and able to tolerate PO.  Pain has been managed or a plan has been made for home management and has no complaints prior to discharge. Patient is comfortable with above plan and for discharge at this time. All questions were answered prior to disposition. Results from the ER workup discussed with the patient face to face and all questions answered to the best of my ability. The patient is safe for discharge with strict return precautions. Patient appears safe for discharge with appropriate follow-up. Conveyed my impression with the patient and they voiced understanding and are agreeable to plan.   An After Visit Summary was printed and given to the patient.  Portions of this note were generated with [employer]. Dictation errors may occur despite best attempts at  proofreading.    Final Clinical Impression(s) / ED Diagnoses Final diagnoses:  Left ureteral stone    Rx / DC Orders ED Discharge Orders         Ordered    tamsulosin (FLOMAX) 0.4 MG CAPS capsule  Daily after breakfast     03/26/19 1444    ondansetron (ZOFRAN ODT) 4 MG disintegrating tablet  Every 8 hours PRN     03/26/19 1445           Tedd Sias, [place] 03/26/19 1447    Carmin Muskrat, MD 03/29/19 2025

## 2019-03-27 ENCOUNTER — Encounter: Payer: Self-pay | Admitting: Registered Nurse

## 2019-03-27 NOTE — Progress Notes (Signed)
Acute Office Visit  Subjective:    Patient ID: Derrick Barr, male    DOB: 1970-03-03, 49 y.o.   MRN: [id number]  Chief Complaint  Patient presents with  . Back Pain    possible kidney stone lower back pain started today . Per patient he took tylenol 4 hours ago    HPI Patient is in today for flank pain Ongoing since this morning Worsening No urinary symptoms at this time Hx of renal stone - this feels the same.   No systemic symptoms at this time.  Past Medical History:  Diagnosis Date  . Gout     No past surgical history on file.  No family history on file.  Social History   Socioeconomic History  . Marital status: Married    Spouse name: Not on file  . Number of children: Not on file  . Years of education: Not on file  . Highest education level: Not on file  Occupational History  . Not on file  Tobacco Use  . Smoking status: Never Smoker  . Smokeless tobacco: Never Used  Substance and Sexual Activity  . Alcohol use: No    Alcohol/week: 0.0 standard drinks  . Drug use: No  . Sexual activity: Yes  Other Topics Concern  . Not on file  Social History Narrative  . Not on file   Social Determinants of Health   Financial Resource Strain:   . Difficulty of Paying Living Expenses: Not on file  Food Insecurity:   . Worried About Programme researcher, broadcasting/film/video in the Last Year: Not on file  . Ran Out of Food in the Last Year: Not on file  Transportation Needs:   . Lack of Transportation (Medical): Not on file  . Lack of Transportation (Non-Medical): Not on file  Physical Activity:   . Days of Exercise per Week: Not on file  . Minutes of Exercise per Session: Not on file  Stress:   . Feeling of Stress : Not on file  Social Connections:   . Frequency of Communication with Friends and Family: Not on file  . Frequency of Social Gatherings with Friends and Family: Not on file  . Attends Religious Services: Not on file  . Active Member of Clubs or Organizations: Not  on file  . Attends Banker Meetings: Not on file  . Marital Status: Not on file  Intimate Partner Violence:   . Fear of Current or Ex-Partner: Not on file  . Emotionally Abused: Not on file  . Physically Abused: Not on file  . Sexually Abused: Not on file    Outpatient Medications Prior to Visit  Medication Sig Dispense Refill  . allopurinol (ZYLOPRIM) 100 MG tablet TAKE 1 TABLET TWICE A DAY (Patient taking differently: Take 100 mg by mouth 2 (two) times daily. ) 60 tablet 6  . clonazePAM (KLONOPIN) 1 MG tablet Take 0.5-1 tablets (0.5-1 mg total) by mouth 2 (two) times daily. Take for anxiety only. (Patient not taking: Reported on 11/26/2015) 20 tablet 0  . Fluoxetine HCl, PMDD, 20 MG CAPS Take 1 capsule (20 mg total) by mouth daily. (Patient not taking: Reported on 11/26/2015) 30 each 2  . ondansetron (ZOFRAN) 4 MG tablet Take 1 tablet (4 mg total) by mouth every 6 (six) hours. (Patient not taking: Reported on 02/22/2019) 12 tablet 0  . oxyCODONE HCl 7.5 MG TABS Take 7.5 mg by mouth 3 (three) times daily. (Patient not taking: Reported on 03/19/2019) 42 tablet  0  . tamsulosin (FLOMAX) 0.4 MG CAPS capsule Take 1 capsule (0.4 mg total) by mouth daily. (Patient not taking: Reported on 02/22/2019) 7 capsule 0   No facility-administered medications prior to visit.    No Known Allergies  Review of Systems  Constitutional: Negative.   HENT: Negative.   Eyes: Negative.   Respiratory: Negative.   Cardiovascular: Negative.   Gastrointestinal: Negative.   Endocrine: Negative.   Genitourinary: Negative.   Musculoskeletal: Positive for back pain.  Skin: Negative.   Allergic/Immunologic: Negative.   Neurological: Negative.   Hematological: Negative.   Psychiatric/Behavioral: Negative.   All other systems reviewed and are negative.      Objective:    Physical Exam Vitals and nursing note reviewed.  Constitutional:      General: He is not in acute distress.    Appearance:  Normal appearance. He is normal weight. He is ill-appearing. He is not toxic-appearing or diaphoretic.  Cardiovascular:     Rate and Rhythm: Normal rate and regular rhythm.  Musculoskeletal:        General: Tenderness (flank) present.  Skin:    Capillary Refill: Capillary refill takes less than 2 seconds.  Neurological:     General: No focal deficit present.     Mental Status: He is alert and oriented to person, place, and time. Mental status is at baseline.  Psychiatric:        Mood and Affect: Mood normal.        Behavior: Behavior normal.        Thought Content: Thought content normal.        Judgment: Judgment normal.     BP (!) 164/100   Pulse 79   Temp 97.6 F (36.4 C) (Temporal)   Resp 16   Ht 5\' 11"  (1.803 m)   Wt 230 lb (104.3 kg)   SpO2 98%   BMI 32.08 kg/m  Wt Readings from Last 3 Encounters:  03/19/19 230 lb (104.3 kg)  02/22/19 239 lb 3.2 oz (108.5 kg)  01/01/15 228 lb (103.4 kg)    There are no preventive care reminders to display for this patient.  There are no preventive care reminders to display for this patient.   Lab Results  Component Value Date   TSH 1.225 01/01/2015   Lab Results  Component Value Date   WBC 13.3 (H) 03/26/2019   HGB 15.4 03/26/2019   HCT 46.6 03/26/2019   MCV 92.5 03/26/2019   PLT 373 03/26/2019   Lab Results  Component Value Date   NA 136 03/26/2019   K 4.1 03/26/2019   CO2 24 03/26/2019   GLUCOSE 163 (H) 03/26/2019   BUN 17 03/26/2019   CREATININE 2.00 (H) 03/26/2019   BILITOT 1.1 03/26/2019   ALKPHOS 167 (H) 03/26/2019   AST 38 03/26/2019   ALT 68 (H) 03/26/2019   PROT 7.6 03/26/2019   ALBUMIN 3.5 03/26/2019   CALCIUM 9.3 03/26/2019   ANIONGAP 12 03/26/2019   No results found for: CHOL No results found for: HDL No results found for: LDLCALC No results found for: TRIG No results found for: CHOLHDL No results found for: 05/26/2019     Assessment & Plan:   Problem List Items Addressed This Visit    None     Visit Diagnoses    Flank pain    -  Primary   Relevant Orders   POCT URINALYSIS DIP (CLINITEK) (Completed)   Comprehensive metabolic panel (Completed)   CBC with Differential (Completed)  Meds ordered this encounter  Medications  . DISCONTD: oxyCODONE HCl 7.5 MG TABS    Sig: Take 7.5 mg by mouth 3 (three) times daily as needed.    Dispense:  42 tablet    Refill:  0    Order Specific Question:   Supervising Provider    Answer:   Delia Chimes A [id number]  . DISCONTD: tamsulosin (FLOMAX) 0.4 MG CAPS capsule    Sig: Take 1 capsule (0.4 mg total) by mouth daily.    Dispense:  7 capsule    Refill:  0    Order Specific Question:   Supervising Provider    Answer:   Delia Chimes A [id number]  . HYDROcodone-acetaminophen (NORCO) 10-325 MG tablet    Sig: Take 1 tablet by mouth every 8 (eight) hours as needed for up to 5 days.    Dispense:  15 tablet    Refill:  0    Order Specific Question:   Supervising Provider    Answer:   Forrest Moron [id number]   PLAN  Likely renal stone  flomax and analgesics  If it does not pass in the coming days, seek emergency care  Patient encouraged to call clinic with any questions, comments, or concerns.  Maximiano Coss, NP

## 2019-03-29 ENCOUNTER — Other Ambulatory Visit: Payer: Self-pay | Admitting: Urology

## 2019-03-29 DIAGNOSIS — N200 Calculus of kidney: Secondary | ICD-10-CM

## 2019-03-30 ENCOUNTER — Other Ambulatory Visit (HOSPITAL_COMMUNITY)
Admission: RE | Admit: 2019-03-30 | Discharge: 2019-03-30 | Disposition: A | Discharge status: Home / Self Care | Payer: 59 | Source: Ambulatory Visit | Attending: Otolaryngology | Admitting: Otolaryngology

## 2019-03-30 ENCOUNTER — Encounter (HOSPITAL_BASED_OUTPATIENT_CLINIC_OR_DEPARTMENT_OTHER): Payer: Self-pay | Admitting: Urology

## 2019-03-30 DIAGNOSIS — Z01812 Encounter for preprocedural laboratory examination: Secondary | ICD-10-CM | POA: Insufficient documentation

## 2019-03-30 DIAGNOSIS — Z20822 Contact with and (suspected) exposure to covid-19: Secondary | ICD-10-CM | POA: Diagnosis not present

## 2019-03-30 LAB — SARS CORONAVIRUS 2 (TAT 6-24 HRS): SARS Coronavirus 2: NEGATIVE

## 2019-03-30 NOTE — H&P (Signed)
Case was cancelled

## 2019-03-31 ENCOUNTER — Other Ambulatory Visit: Payer: Self-pay | Admitting: Urology

## 2019-04-01 ENCOUNTER — Ambulatory Visit (HOSPITAL_BASED_OUTPATIENT_CLINIC_OR_DEPARTMENT_OTHER): Admission: RE | Admit: 2019-04-01 | Payer: 59 | Source: Home / Self Care | Admitting: Urology

## 2019-04-01 SURGERY — LITHOTRIPSY, ESWL
Anesthesia: LOCAL | Laterality: Left

## 2019-04-20 ENCOUNTER — Ambulatory Visit: Payer: Self-pay

## 2019-04-20 ENCOUNTER — Ambulatory Visit (INDEPENDENT_AMBULATORY_CARE_PROVIDER_SITE_OTHER): Payer: 59 | Admitting: Orthopaedic Surgery

## 2019-04-20 ENCOUNTER — Encounter: Payer: Self-pay | Admitting: Physician Assistant

## 2019-04-20 ENCOUNTER — Other Ambulatory Visit: Payer: Self-pay

## 2019-04-20 DIAGNOSIS — M1612 Unilateral primary osteoarthritis, left hip: Secondary | ICD-10-CM | POA: Diagnosis not present

## 2019-04-20 NOTE — Progress Notes (Signed)
   Office Visit Note   Patient: Derrick Barr           Date of Birth: 07-17-1970           MRN: [id number] Visit Date: 04/20/2019              Requested by: No referring provider defined for this encounter. PCP: Derrick Barr   Assessment & Plan: Visit Diagnoses:  1. Unilateral primary osteoarthritis, left hip     Plan: Impression is left hip arthritis flareup.  We will refer the patient to Dr. Prince Barr for ultrasound-guided cortisone injection to the left hip joint.  Follow-up with [place] as needed.  Follow-Up Instructions: Return if symptoms worsen or fail to improve.   Orders:  Orders Placed This Encounter  Procedures  . US Guided Needle Placement - No Linked Charges   No orders of the defined types were placed in this encounter.     Procedures: No procedures performed   Clinical Data: No additional findings.   Subjective: Chief Complaint  Patient presents with  . Left Hip - Pain    HPI patient is a pleasant 49 year old gentleman who comes in today with left hip pain for the past 2 months that has progressively worsened.  The pain he has is to the groin as well as lateral hip.  No known injury to the left hip itself, but he has been having left knee pain since early February.  This has improved quite a bit and he has recently noticed increased pain to the left hip.  Walking as well as going up and down stairs and lifting his left leg seem to make his symptoms worse.  He does get mild relief at rest.  He has taken over-the-counter pain medications with mild relief.  No numbness, tingling or burning to the left lower extremity.  Review of Systems as detailed in HPI.  All others reviewed and are negative.   Objective: Vital Signs: There were no vitals taken for this visit.  Physical Exam well-developed well-nourished gentleman no acute distress.  Alert and oriented x3.  Ortho Exam examination of the left hip reveals a markedly positive FADIR.  Positive logroll.   Negative straight leg raise.  No focal weakness.  He is neurovascular intact distally.  Specialty Comments:  No specialty comments available.  Imaging: Previous imaging of the left hip reviewed by me in canopy shows mild joint space narrowing   PMFS History: Patient Active Problem List   Diagnosis Date Noted  . Acute idiopathic gout of left knee 03/10/2019  . Depression (emotion) 01/01/2015   Past Medical History:  Diagnosis Date  . Gout     History reviewed. No pertinent family history.  History reviewed. No pertinent surgical history. Social History   Occupational History  . Not on file  Tobacco Use  . Smoking status: Never Smoker  . Smokeless tobacco: Never Used  Substance and Sexual Activity  . Alcohol use: No    Alcohol/week: 0.0 standard drinks  . Drug use: No  . Sexual activity: Yes

## 2019-04-20 NOTE — Progress Notes (Signed)
Subjective: Patient is here for ultrasound-guided intra-articular left hip injection.     Objective:  Pain and stiffness with IR.  Procedure: Ultrasound-guided left hip injection: After sterile prep with Betadine, injected 8 cc 1% lidocaine without epinephrine and 40 mg methylprednisolone using a 22-gauge spinal needle, passing the needle through the iliofemoral ligament into the femoral head/neck junction.  Injectate seen filling joint capsule.  Good immediate relief.

## 2019-06-01 ENCOUNTER — Other Ambulatory Visit: Payer: Self-pay

## 2019-06-01 ENCOUNTER — Encounter: Payer: Self-pay | Admitting: Orthopaedic Surgery

## 2019-06-01 ENCOUNTER — Ambulatory Visit (INDEPENDENT_AMBULATORY_CARE_PROVIDER_SITE_OTHER): Payer: 59 | Admitting: Orthopaedic Surgery

## 2019-06-01 VITALS — Ht 70.5 in | Wt 229.0 lb

## 2019-06-01 DIAGNOSIS — M1612 Unilateral primary osteoarthritis, left hip: Secondary | ICD-10-CM | POA: Diagnosis not present

## 2019-06-01 NOTE — Progress Notes (Signed)
   Office Visit Note   Patient: Derrick Barr           Date of Birth: 1970-03-13           MRN: [id number] Visit Date: 06/01/2019              Requested by: No referring provider defined for this encounter. PCP: Patient, [name]   Assessment & Plan: Visit Diagnoses:  1. Unilateral primary osteoarthritis, left hip     Plan: Impression is left hip DJD with temporary relief from cortisone injection.  At this point after a long discussion on treatment options and based on the risk benefits rehab recovery of a left total hip replacement the patient would like to move forward with the surgery.  Denies history of DVT.  He would like to have this done as soon as possible.  Questions encouraged and answered.  Follow-Up Instructions: Return for 2 week postop visit.   Orders:  No orders of the defined types were placed in this encounter.  No orders of the defined types were placed in this encounter.     Procedures: No procedures performed   Clinical Data: No additional findings.   Subjective: Chief Complaint  Patient presents with  . Left Hip - Pain  . Left Knee - Pain    Derrick Barr is a 49 year old gentleman who follows up today in pain.  Left hip injection was done about 6 to 7 weeks ago only helped for about 2 to 3 days but it did significantly relieve his symptoms.  He feels a lot of popping in his left hip as well.  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   Review of Systems  Constitutional: Negative.   All other systems reviewed and are negative.    Objective: Vital Signs: Ht 5' 10.5" (1.791 m)   Wt 229 lb (103.9 kg)   BMI 32.39 kg/m   Physical Exam Vitals and nursing note reviewed.  Constitutional:      Appearance: He is well-developed.  Pulmonary:     Effort: Pulmonary effort is normal.  Abdominal:     Palpations: Abdomen is soft.  Skin:    General: Skin is warm.  Neurological:     Mental Status:  He is alert and oriented to person, place, and time.  Psychiatric:        Behavior: Behavior normal.        Thought Content: Thought content normal.        Judgment: Judgment normal.     Ortho Exam Left hip shows painful internal and external rotation.  Positive Stinchfield. Specialty Comments:  No specialty comments available.  Imaging: No results found.   PMFS History: Patient Active Problem List   Diagnosis Date Noted  . Unilateral primary osteoarthritis, left hip 06/01/2019  . Acute idiopathic gout of left knee 03/10/2019  . Depression (emotion) 01/01/2015   Past Medical History:  Diagnosis Date  . Gout     History reviewed. No pertinent family history.  History reviewed. No pertinent surgical history. Social History   Occupational History  . Not on file  Tobacco Use  . Smoking status: Never Smoker  . Smokeless tobacco: Never Used  Substance and Sexual Activity  . Alcohol use: No    Alcohol/week: 0.0 standard drinks  . Drug use: No  . Sexual activity: Yes

## 2019-06-07 ENCOUNTER — Other Ambulatory Visit: Payer: Self-pay

## 2019-06-16 ENCOUNTER — Other Ambulatory Visit: Payer: Self-pay | Admitting: Family

## 2019-06-23 NOTE — Progress Notes (Signed)
[place], [place] [address] Phone: [phone] Fax: [phone]  [id number] - [place], [place] - [address] [place] [address] Phone: [phone] Fax: [phone]   Your procedure is scheduled on Monday, June 7th.  Report to [facility] Main Entrance "A" at 10:45 A.M., and check in at the Admitting office.  Call this number if you have problems the morning of surgery:  [phone]  Call [phone] if you have any questions prior to your surgery date Monday-Friday 8am-4pm   Remember:  Do not eat after midnight the night before your surgery  You may drink clear liquids until 9:45 A.M. the morning of your surgery.   Clear liquids allowed are: Water, Non-Citrus Juices (without pulp), Carbonated Beverages, Clear Tea, Black Coffee Only, and Gatorade.  Enhanced Recovery after Surgery for Orthopedics Enhanced Recovery after Surgery is a protocol used to improve the stress on your body and your recovery after surgery.  Patient Instructions  . The night before surgery:  o No food after midnight. ONLY clear liquids after midnight  .  Marland Kitchen The day of surgery (if you do NOT have diabetes):  o Drink ONE (1) Pre-Surgery Clear Ensure by 9:45 A.M. the morning of surgery. Pre-Surgery Ensure depending on your surgery time. o Finish the drink at the designated time by the pre-op nurse.  o Nothing else to drink after completing the  Pre-Surgery Clear Ensure.         If you have questions, please contact your surgeon's office.   Take these medicines the morning of surgery with A SIP OF WATER  allopurinol (ZYLOPRIM) sertraline (ZOLOFT)   As of today, STOP taking diclofenac Sodium (VOLTAREN), any Aspirin (unless otherwise instructed by your surgeon), Aspirin containing products, Aleve, Naproxen, Ibuprofen, Motrin, Advil, Goody's, BC's, all herbal medications,  fish oil, and all vitamins.                     Do not wear jewelry, make up, or nail polish            Do not wear lotions, powders, colognes, or deodorant.            Men may shave face and neck.            Do not bring valuables to the hospital.            [facility] is not responsible for any belongings or valuables.  Do NOT Smoke (Tobacco/Vapping) or drink Alcohol 24 hours prior to your procedure If you use a CPAP at night, you may bring all equipment for your overnight stay.   Contacts, glasses, dentures or bridgework may not be worn into surgery.      For patients admitted to the hospital, discharge time will be determined by your treatment team.   Patients discharged the day of surgery will not be allowed to drive home, and someone needs to stay with them for 24 hours.  Special instructions:   [place]- Preparing For Surgery  Before surgery, you can play an important role. Because skin is not sterile, your skin needs to be as free of germs as possible. You can reduce the number of germs on your skin by washing with CHG (chlorahexidine gluconate) Soap before surgery.  CHG is an antiseptic cleaner which kills germs and bonds with the skin to continue killing germs even after washing.  Oral Hygiene is also important to reduce your risk of infection.  Remember - BRUSH YOUR TEETH THE MORNING OF SURGERY WITH YOUR REGULAR TOOTHPASTE  Please do not use if you have an allergy to CHG or antibacterial soaps. If your skin becomes reddened/irritated stop using the CHG.  Do not shave (including legs and underarms) for at least 48 hours prior to first CHG shower. It is OK to shave your face.  Please follow these instructions carefully.   1. Shower the NIGHT BEFORE SURGERY and the MORNING OF SURGERY with CHG Soap.   2. If you chose to wash your hair, wash your hair first as usual with your normal shampoo.  3. After you shampoo, rinse your hair and body thoroughly to remove the  shampoo.  4. Use CHG as you would any other liquid soap. You can apply CHG directly to the skin and wash gently with a scrungie or a clean washcloth.   5. Apply the CHG Soap to your body ONLY FROM THE NECK DOWN.  Do not use on open wounds or open sores. Avoid contact with your eyes, ears, mouth and genitals (private parts). Wash Face and genitals (private parts)  with your normal soap.   6. Wash thoroughly, paying special attention to the area where your surgery will be performed.  7. Thoroughly rinse your body with warm water from the neck down.  8. DO NOT shower/wash with your normal soap after using and rinsing off the CHG Soap.  9. Pat yourself dry with a CLEAN TOWEL.  10. Wear CLEAN PAJAMAS to bed the night before surgery, wear comfortable clothes the morning of surgery  11. Place CLEAN SHEETS on your bed the night of your first shower and DO NOT SLEEP WITH PETS.  Day of Surgery: Shower with CHG soap as instructed above.  Do not apply any deodorants/lotions.  Please wear clean clothes to the hospital/surgery center.   Remember to brush your teeth WITH YOUR REGULAR TOOTHPASTE.   Please read over the following fact sheets that you were given.

## 2019-06-24 ENCOUNTER — Other Ambulatory Visit (HOSPITAL_COMMUNITY): Payer: 59

## 2019-06-24 ENCOUNTER — Other Ambulatory Visit: Payer: Self-pay

## 2019-06-24 ENCOUNTER — Ambulatory Visit (HOSPITAL_COMMUNITY)
Admission: RE | Admit: 2019-06-24 | Discharge: 2019-06-24 | Disposition: A | Discharge status: Home / Self Care | Payer: 59 | Source: Ambulatory Visit | Attending: Family | Admitting: Family

## 2019-06-24 ENCOUNTER — Encounter (HOSPITAL_COMMUNITY)
Admission: RE | Admit: 2019-06-24 | Discharge: 2019-06-24 | Disposition: A | Discharge status: Home / Self Care | Payer: 59 | Source: Ambulatory Visit | Attending: Orthopaedic Surgery | Admitting: Orthopaedic Surgery

## 2019-06-24 ENCOUNTER — Other Ambulatory Visit (HOSPITAL_COMMUNITY)
Admission: RE | Admit: 2019-06-24 | Discharge: 2019-06-24 | Disposition: A | Discharge status: Home / Self Care | Payer: 59 | Source: Ambulatory Visit | Attending: Orthopaedic Surgery | Admitting: Orthopaedic Surgery

## 2019-06-24 ENCOUNTER — Encounter (HOSPITAL_COMMUNITY): Payer: Self-pay

## 2019-06-24 DIAGNOSIS — Z20822 Contact with and (suspected) exposure to covid-19: Secondary | ICD-10-CM | POA: Diagnosis not present

## 2019-06-24 DIAGNOSIS — Z01818 Encounter for other preprocedural examination: Secondary | ICD-10-CM | POA: Insufficient documentation

## 2019-06-24 DIAGNOSIS — Z01811 Encounter for preprocedural respiratory examination: Secondary | ICD-10-CM

## 2019-06-24 HISTORY — DX: Depression, unspecified: F32.A

## 2019-06-24 HISTORY — DX: Personal history of urinary calculi: Z87.442

## 2019-06-24 HISTORY — DX: Anxiety disorder, unspecified: F41.9

## 2019-06-24 LAB — URINALYSIS, ROUTINE W REFLEX MICROSCOPIC
Bilirubin Urine: NEGATIVE
Glucose, UA: NEGATIVE mg/dL
Ketones, ur: NEGATIVE mg/dL
Leukocytes,Ua: NEGATIVE
Nitrite: NEGATIVE
Protein, ur: 30 mg/dL — AB
RBC / HPF: >50 RBC/hpf — ABNORMAL HIGH (ref 0–5)
Specific Gravity, Urine: 1.018 (ref 1.005–1.030)
pH: 5 (ref 5.0–8.0)

## 2019-06-24 LAB — CBC
HCT: 49.6 % (ref 39.0–52.0)
Hemoglobin: 16.5 g/dL (ref 13.0–17.0)
MCH: 30.2 pg (ref 26.0–34.0)
MCHC: 33.3 g/dL (ref 30.0–36.0)
MCV: 90.8 fL (ref 80.0–100.0)
Platelets: 287 10*3/uL (ref 150–400)
RBC: 5.46 MIL/uL (ref 4.22–5.81)
RDW: 13.7 % (ref 11.5–15.5)
WBC: 11.1 10*3/uL — ABNORMAL HIGH (ref 4.0–10.5)
nRBC: 0 % (ref 0.0–0.2)

## 2019-06-24 LAB — COMPREHENSIVE METABOLIC PANEL WITH GFR
ALT: 32 U/L (ref 0–44)
AST: 30 U/L (ref 15–41)
Albumin: 4.2 g/dL (ref 3.5–5.0)
Alkaline Phosphatase: 72 U/L (ref 38–126)
Anion gap: 9 (ref 5–15)
BUN: 7 mg/dL (ref 6–20)
CO2: 29 mmol/L (ref 22–32)
Calcium: 9.7 mg/dL (ref 8.9–10.3)
Chloride: 101 mmol/L (ref 98–111)
Creatinine, Ser: 1.02 mg/dL (ref 0.61–1.24)
GFR calc Af Amer: >60 mL/min
GFR calc non Af Amer: >60 mL/min
Glucose, Bld: 113 mg/dL — ABNORMAL HIGH (ref 70–99)
Potassium: 4 mmol/L (ref 3.5–5.1)
Sodium: 139 mmol/L (ref 135–145)
Total Bilirubin: 1 mg/dL (ref 0.3–1.2)
Total Protein: 7.6 g/dL (ref 6.5–8.1)

## 2019-06-24 LAB — PROTIME-INR
INR: 1 (ref 0.8–1.2)
Prothrombin Time: 13 s (ref 11.4–15.2)

## 2019-06-24 LAB — APTT: aPTT: 31 s (ref 24–36)

## 2019-06-24 LAB — SARS CORONAVIRUS 2 (TAT 6-24 HRS): SARS Coronavirus 2: NEGATIVE

## 2019-06-24 LAB — SURGICAL PCR SCREEN
MRSA, PCR: NEGATIVE
Staphylococcus aureus: NEGATIVE

## 2019-06-24 NOTE — Progress Notes (Signed)
PCP - PA at [facility] office  Chest x-ray - today EKG - N/A  ERAS Protcol - yes PRE-SURGERY Ensure - yes  COVID TEST- today   Anesthesia review: No  Patient denies shortness of breath, fever, cough and chest pain at PAT appointment   All instructions explained to the patient, with a verbal understanding of the material. Patient agrees to go over the instructions while at home for a better understanding. Patient also instructed to self quarantine after being tested for COVID-19. The opportunity to ask questions was provided.

## 2019-06-24 NOTE — Pre-Procedure Instructions (Signed)
Derrick Barr  06/24/2019    Your procedure is scheduled on Monday, June 28, 2019 at 12:45 PM.   Report to [facility] Entrance "A" Admitting Office at 10:45 AM.   Call this number if you have problems the morning of surgery: [phone]   Questions prior to day of surgery, please call [phone] between 8 & 4 PM.   Remember:  Do not eat food after midnight Sunday, 06/27/19.  You may drink clear liquids until 9:45 AM.  Clear liquids allowed are: Water, Juice (non-citric and without pulp - diabetics please choose diet or no sugar options), Carbonated beverages - (diabetics please choose diet or no sugar options), Clear Tea, Black Coffee only (no creamer, milk or cream including half and half) and Gatorade (diabetics please choose diet or no sugar options)  Drink the Pre-Surgery Ensure between 9:30 AM and 9:45 AM day of surgery. This will be the last liquids that you will have prior to surgery.    Take these medicines the morning of surgery with A SIP OF WATER: Allopurinol (Zyloprim), Sertraline (Zoloft)  Stop NSAIDS (Voltaren, Diclofenac, Ibuprofen, Aleve, etc) as of today prior to surgery. Do not use Aspirin containing products, Multivitamins, Herbal medications or Fish Oil prior to surgery.    Do not wear jewelry.  Do not wear lotions, powders, cologne or deodorant.  Men may shave face and neck.  Do not bring valuables to the hospital.  [facility] is not responsible for any belongings or valuables.  Contacts, dentures or bridgework may not be worn into surgery.  Leave your suitcase in the car.  After surgery it may be brought to your room.  For patients admitted to the hospital, discharge time will be determined by your treatment team.  [facility] - Preparing for Surgery  Before surgery, you can play an important role.  Because skin is not sterile, your skin needs to be as free of germs as possible.  You can reduce the number of germs on you skin by washing with CHG  (chlorahexidine gluconate) soap before surgery.  CHG is an antiseptic cleaner which kills germs and bonds with the skin to continue killing germs even after washing.  Oral Hygiene is also important in reducing the risk of infection.  Remember to brush your teeth with your regular toothpaste the morning of surgery.  Please DO NOT use if you have an allergy to CHG or antibacterial soaps.  If your skin becomes reddened/irritated stop using the CHG and inform your nurse when you arrive at Short Stay.  Do not shave (including legs and underarms) for at least 48 hours prior to the first CHG shower.  You may shave your face.  Please follow these instructions carefully:   1.  Shower with CHG Soap the night before surgery and the morning of Surgery.  2.  If you choose to wash your hair, wash your hair first as usual with your normal shampoo.  3.  After you shampoo, rinse your hair and body thoroughly to remove the shampoo. 4.  Use CHG as you would any other liquid soap.  You can apply chg directly to the skin and wash gently with a      scrungie or washcloth.           5.  Apply the CHG Soap to your body ONLY FROM THE NECK DOWN.   Do not use on open wounds or open sores. Avoid contact with your eyes, ears, mouth and genitals (private  parts).  Wash genitals (private parts) with your normal soap - do this prior to using CHG soap.  6.  Wash thoroughly, paying special attention to the area where your surgery will be performed.  7.  Thoroughly rinse your body with warm water from the neck down.  8.  DO NOT shower/wash with your normal soap after using and rinsing off the CHG Soap.  9.  Pat yourself dry with a clean towel.            10.  Wear clean pajamas.            11.  Place clean sheets on your bed the night of your first shower and do not sleep with pets.  Day of Surgery  Shower as above.  Do not apply any lotions/deodorants the morning of surgery.   Please wear clean clothes to the  hospital. Remember to brush your teeth with toothpaste.  Please read over the fact sheets that you were given.

## 2019-06-24 NOTE — Progress Notes (Signed)
Called Urinalysis report to [facility], surgery scheduler for Dr. Roda Shutters. Large Hemoglobin, Protein - 30, RBC/HPF >50, Bacteria - rare. She states she will notify Dr. Roda Shutters.

## 2019-06-25 MED ORDER — TRANEXAMIC ACID 1000 MG/10ML IV SOLN
2000.0000 mg | INTRAVENOUS | Status: AC
  Administered 2019-06-28: 2000 mg via TOPICAL
  Filled 2019-06-25: qty 20

## 2019-06-25 MED ORDER — BUPIVACAINE LIPOSOME 1.3 % IJ SUSP
10.0000 mL | Freq: Once | INTRAMUSCULAR | Status: DC
  Filled 2019-06-25: qty 10

## 2019-06-27 ENCOUNTER — Other Ambulatory Visit: Payer: Self-pay | Admitting: Orthopaedic Surgery

## 2019-06-27 MED ORDER — ONDANSETRON HCL 4 MG PO TABS: 4.0000 mg | ORAL_TABLET | Freq: Three times a day (TID) | ORAL | 0 refills | Status: DC | PRN

## 2019-06-27 MED ORDER — OXYCODONE-ACETAMINOPHEN 5-325 MG PO TABS: 1.0000 | ORAL_TABLET | Freq: Three times a day (TID) | ORAL | 0 refills | Status: DC | PRN

## 2019-06-27 MED ORDER — ASPIRIN EC 81 MG PO TBEC: 81.0000 mg | DELAYED_RELEASE_TABLET | Freq: Two times a day (BID) | ORAL | 0 refills | Status: DC

## 2019-06-27 MED ORDER — SENNOSIDES-DOCUSATE SODIUM 8.6-50 MG PO TABS: 1.0000 | ORAL_TABLET | Freq: Every evening | ORAL | 1 refills | Status: DC | PRN

## 2019-06-27 MED ORDER — OXYCODONE HCL ER 10 MG PO T12A: 10.0000 mg | EXTENDED_RELEASE_TABLET | Freq: Two times a day (BID) | ORAL | 0 refills | Status: AC

## 2019-06-27 MED ORDER — METHOCARBAMOL 750 MG PO TABS
750.0000 mg | ORAL_TABLET | Freq: Two times a day (BID) | ORAL | 3 refills | Status: DC | PRN
Start: 2019-06-27 — End: 2021-02-21

## 2019-06-27 NOTE — Discharge Instructions (Signed)

## 2019-06-28 ENCOUNTER — Observation Stay (HOSPITAL_COMMUNITY): Payer: 59

## 2019-06-28 ENCOUNTER — Ambulatory Visit (HOSPITAL_COMMUNITY): Payer: 59 | Admitting: Anesthesiology

## 2019-06-28 ENCOUNTER — Encounter (HOSPITAL_COMMUNITY)
Admission: RE | Disposition: A | Discharge status: Home / Self Care | Payer: Self-pay | Source: Home / Self Care | Attending: Orthopaedic Surgery

## 2019-06-28 ENCOUNTER — Encounter (HOSPITAL_COMMUNITY): Payer: Self-pay | Admitting: Orthopaedic Surgery

## 2019-06-28 ENCOUNTER — Ambulatory Visit (HOSPITAL_COMMUNITY): Payer: 59

## 2019-06-28 ENCOUNTER — Observation Stay (HOSPITAL_COMMUNITY)
Admission: RE | Admit: 2019-06-28 | Discharge: 2019-06-29 | Disposition: A | Discharge status: Home / Self Care | Payer: 59 | Attending: Orthopaedic Surgery | Admitting: Orthopaedic Surgery

## 2019-06-28 ENCOUNTER — Other Ambulatory Visit: Payer: Self-pay

## 2019-06-28 DIAGNOSIS — F419 Anxiety disorder, unspecified: Secondary | ICD-10-CM | POA: Insufficient documentation

## 2019-06-28 DIAGNOSIS — M1990 Unspecified osteoarthritis, unspecified site: Secondary | ICD-10-CM | POA: Diagnosis present

## 2019-06-28 DIAGNOSIS — Z96642 Presence of left artificial hip joint: Secondary | ICD-10-CM

## 2019-06-28 DIAGNOSIS — F329 Major depressive disorder, single episode, unspecified: Secondary | ICD-10-CM | POA: Insufficient documentation

## 2019-06-28 DIAGNOSIS — Z791 Long term (current) use of non-steroidal anti-inflammatories (NSAID): Secondary | ICD-10-CM | POA: Insufficient documentation

## 2019-06-28 DIAGNOSIS — Z419 Encounter for procedure for purposes other than remedying health state, unspecified: Secondary | ICD-10-CM

## 2019-06-28 DIAGNOSIS — Z7982 Long term (current) use of aspirin: Secondary | ICD-10-CM | POA: Diagnosis not present

## 2019-06-28 DIAGNOSIS — Z87442 Personal history of urinary calculi: Secondary | ICD-10-CM | POA: Diagnosis not present

## 2019-06-28 DIAGNOSIS — Z79899 Other long term (current) drug therapy: Secondary | ICD-10-CM | POA: Diagnosis not present

## 2019-06-28 DIAGNOSIS — M109 Gout, unspecified: Secondary | ICD-10-CM | POA: Diagnosis not present

## 2019-06-28 DIAGNOSIS — M1612 Unilateral primary osteoarthritis, left hip: Principal | ICD-10-CM | POA: Diagnosis present

## 2019-06-28 DIAGNOSIS — Z96649 Presence of unspecified artificial hip joint: Secondary | ICD-10-CM

## 2019-06-28 HISTORY — PX: TOTAL HIP ARTHROPLASTY: SHX124

## 2019-06-28 SURGERY — ARTHROPLASTY, HIP, TOTAL, ANTERIOR APPROACH
Anesthesia: Monitor Anesthesia Care | Site: Hip | Laterality: Left

## 2019-06-28 MED ORDER — SORBITOL 70 % SOLN: 30.0000 mL | Freq: Every day | Status: DC | PRN

## 2019-06-28 MED ORDER — HYDROMORPHONE HCL 1 MG/ML IJ SOLN: 0.5000 mg | INTRAMUSCULAR | Status: DC | PRN

## 2019-06-28 MED ORDER — FENTANYL CITRATE (PF) 100 MCG/2ML IJ SOLN
INTRAMUSCULAR | Status: DC | PRN
  Administered 2019-06-28: 50 ug via INTRAVENOUS

## 2019-06-28 MED ORDER — METOCLOPRAMIDE HCL 5 MG/ML IJ SOLN: 5.0000 mg | Freq: Three times a day (TID) | INTRAMUSCULAR | Status: DC | PRN

## 2019-06-28 MED ORDER — BUPIVACAINE IN DEXTROSE 0.75-8.25 % IT SOLN
INTRATHECAL | Status: DC | PRN
  Administered 2019-06-28: 2 mL via INTRATHECAL

## 2019-06-28 MED ORDER — FENTANYL CITRATE (PF) 100 MCG/2ML IJ SOLN
25.0000 ug | INTRAMUSCULAR | Status: DC | PRN
  Administered 2019-06-28: 50 ug via INTRAVENOUS

## 2019-06-28 MED ORDER — KETOROLAC TROMETHAMINE 15 MG/ML IJ SOLN
INTRAMUSCULAR | Status: AC
  Filled 2019-06-28: qty 1

## 2019-06-28 MED ORDER — 0.9 % SODIUM CHLORIDE (POUR BTL) OPTIME
TOPICAL | Status: DC | PRN
  Administered 2019-06-28: 1000 mL

## 2019-06-28 MED ORDER — SODIUM CHLORIDE 0.9 % IV SOLN
INTRAVENOUS | Status: DC | PRN
  Administered 2019-06-28: 40 mL

## 2019-06-28 MED ORDER — OXYCODONE HCL 5 MG PO TABS
ORAL_TABLET | ORAL | Status: AC
  Filled 2019-06-28: qty 2

## 2019-06-28 MED ORDER — TRANEXAMIC ACID-NACL 1000-0.7 MG/100ML-% IV SOLN
1000.0000 mg | INTRAVENOUS | Status: AC
  Administered 2019-06-28: 1000 mg via INTRAVENOUS
  Filled 2019-06-28: qty 100

## 2019-06-28 MED ORDER — CEFAZOLIN SODIUM-DEXTROSE 2-4 GM/100ML-% IV SOLN
2.0000 g | Freq: Four times a day (QID) | INTRAVENOUS | Status: AC
  Administered 2019-06-28 – 2019-06-29 (×3): 2 g via INTRAVENOUS
  Filled 2019-06-28 (×3): qty 100

## 2019-06-28 MED ORDER — CHLORHEXIDINE GLUCONATE 0.12 % MT SOLN
15.0000 mL | Freq: Once | OROMUCOSAL | Status: AC
  Administered 2019-06-28: 15 mL via OROMUCOSAL

## 2019-06-28 MED ORDER — PHENYLEPHRINE HCL-NACL 10-0.9 MG/250ML-% IV SOLN
INTRAVENOUS | Status: DC | PRN
Start: 2019-06-28 — End: 2019-06-28
  Administered 2019-06-28: 25 ug/min via INTRAVENOUS

## 2019-06-28 MED ORDER — MAGNESIUM CITRATE PO SOLN: 1.0000 | Freq: Once | ORAL | Status: DC | PRN

## 2019-06-28 MED ORDER — DOCUSATE SODIUM 100 MG PO CAPS
100.0000 mg | ORAL_CAPSULE | Freq: Two times a day (BID) | ORAL | Status: DC
  Filled 2019-06-28 (×2): qty 1

## 2019-06-28 MED ORDER — EPHEDRINE 5 MG/ML INJ
INTRAVENOUS | Status: AC
  Filled 2019-06-28: qty 10

## 2019-06-28 MED ORDER — ONDANSETRON HCL 4 MG/2ML IJ SOLN
INTRAMUSCULAR | Status: DC | PRN
Start: 2019-06-28 — End: 2019-06-28
  Administered 2019-06-28: 4 mg via INTRAVENOUS

## 2019-06-28 MED ORDER — BUPIVACAINE-EPINEPHRINE 0.25% -1:200000 IJ SOLN
INTRAMUSCULAR | Status: DC | PRN
  Administered 2019-06-28: 20 mL

## 2019-06-28 MED ORDER — FENTANYL CITRATE (PF) 250 MCG/5ML IJ SOLN
INTRAMUSCULAR | Status: AC
  Filled 2019-06-28: qty 5

## 2019-06-28 MED ORDER — POLYETHYLENE GLYCOL 3350 17 G PO PACK: 17.0000 g | PACK | Freq: Every day | ORAL | Status: DC | PRN

## 2019-06-28 MED ORDER — MIDAZOLAM HCL 2 MG/2ML IJ SOLN
INTRAMUSCULAR | Status: AC
  Filled 2019-06-28: qty 2

## 2019-06-28 MED ORDER — ACETAMINOPHEN 500 MG PO TABS
1000.0000 mg | ORAL_TABLET | Freq: Four times a day (QID) | ORAL | Status: AC
  Administered 2019-06-28 – 2019-06-29 (×4): 1000 mg via ORAL
  Filled 2019-06-28 (×4): qty 2

## 2019-06-28 MED ORDER — PROPOFOL 10 MG/ML IV BOLUS
INTRAVENOUS | Status: AC
  Filled 2019-06-28: qty 20

## 2019-06-28 MED ORDER — EPINEPHRINE PF 1 MG/ML IJ SOLN
INTRAMUSCULAR | Status: AC
  Filled 2019-06-28: qty 1

## 2019-06-28 MED ORDER — ONDANSETRON HCL 4 MG/2ML IJ SOLN: 4.0000 mg | Freq: Four times a day (QID) | INTRAMUSCULAR | Status: DC | PRN

## 2019-06-28 MED ORDER — ASPIRIN 81 MG PO CHEW
81.0000 mg | CHEWABLE_TABLET | Freq: Two times a day (BID) | ORAL | Status: DC
  Administered 2019-06-28 – 2019-06-29 (×2): 81 mg via ORAL
  Filled 2019-06-28 (×2): qty 1

## 2019-06-28 MED ORDER — ACETAMINOPHEN 325 MG PO TABS: 325.0000 mg | ORAL_TABLET | Freq: Four times a day (QID) | ORAL | Status: DC | PRN

## 2019-06-28 MED ORDER — IRRISEPT - 450ML BOTTLE WITH 0.05% CHG IN STERILE WATER, USP 99.95% OPTIME
TOPICAL | Status: DC | PRN
  Administered 2019-06-28: 450 mL via TOPICAL

## 2019-06-28 MED ORDER — PHENOL 1.4 % MT LIQD: 1.0000 | OROMUCOSAL | Status: DC | PRN

## 2019-06-28 MED ORDER — SERTRALINE HCL 50 MG PO TABS
50.0000 mg | ORAL_TABLET | Freq: Every day | ORAL | Status: DC
  Administered 2019-06-29: 50 mg via ORAL
  Filled 2019-06-28: qty 1

## 2019-06-28 MED ORDER — ONDANSETRON HCL 4 MG PO TABS: 4.0000 mg | ORAL_TABLET | Freq: Four times a day (QID) | ORAL | Status: DC | PRN

## 2019-06-28 MED ORDER — SODIUM CHLORIDE 0.9 % IR SOLN
Status: DC | PRN
  Administered 2019-06-28: 3000 mL

## 2019-06-28 MED ORDER — PHENYLEPHRINE 40 MCG/ML (10ML) SYRINGE FOR IV PUSH (FOR BLOOD PRESSURE SUPPORT)
PREFILLED_SYRINGE | INTRAVENOUS | Status: AC
  Filled 2019-06-28: qty 10

## 2019-06-28 MED ORDER — TRAZODONE HCL 50 MG PO TABS: 50.0000 mg | ORAL_TABLET | Freq: Every evening | ORAL | Status: DC | PRN

## 2019-06-28 MED ORDER — KETOROLAC TROMETHAMINE 15 MG/ML IJ SOLN
15.0000 mg | Freq: Four times a day (QID) | INTRAMUSCULAR | Status: AC
  Administered 2019-06-28 – 2019-06-29 (×4): 15 mg via INTRAVENOUS
  Filled 2019-06-28 (×3): qty 1

## 2019-06-28 MED ORDER — GABAPENTIN 300 MG PO CAPS
300.0000 mg | ORAL_CAPSULE | Freq: Three times a day (TID) | ORAL | Status: DC
  Administered 2019-06-28 – 2019-06-29 (×2): 300 mg via ORAL
  Filled 2019-06-28 (×2): qty 1

## 2019-06-28 MED ORDER — OXYCODONE HCL 5 MG PO TABS: 10.0000 mg | ORAL_TABLET | ORAL | Status: DC | PRN

## 2019-06-28 MED ORDER — BUPIVACAINE HCL (PF) 0.25 % IJ SOLN
INTRAMUSCULAR | Status: AC
  Filled 2019-06-28: qty 30

## 2019-06-28 MED ORDER — CHLORHEXIDINE GLUCONATE 0.12 % MT SOLN
OROMUCOSAL | Status: AC
  Filled 2019-06-28: qty 15

## 2019-06-28 MED ORDER — ALUM & MAG HYDROXIDE-SIMETH 200-200-20 MG/5ML PO SUSP: 30.0000 mL | ORAL | Status: DC | PRN

## 2019-06-28 MED ORDER — POVIDONE-IODINE 10 % EX SWAB: 2.0000 "application " | Freq: Once | CUTANEOUS | Status: DC

## 2019-06-28 MED ORDER — LIDOCAINE 2% (20 MG/ML) 5 ML SYRINGE
INTRAMUSCULAR | Status: AC
  Filled 2019-06-28: qty 5

## 2019-06-28 MED ORDER — METOCLOPRAMIDE HCL 5 MG PO TABS: 5.0000 mg | ORAL_TABLET | Freq: Three times a day (TID) | ORAL | Status: DC | PRN

## 2019-06-28 MED ORDER — VANCOMYCIN HCL 1000 MG IV SOLR
INTRAVENOUS | Status: AC
  Filled 2019-06-28: qty 1000

## 2019-06-28 MED ORDER — PHENYLEPHRINE HCL (PRESSORS) 10 MG/ML IV SOLN
INTRAVENOUS | Status: DC | PRN
Start: 2019-06-28 — End: 2019-06-28
  Administered 2019-06-28: 80 ug via INTRAVENOUS

## 2019-06-28 MED ORDER — ACETAMINOPHEN 500 MG PO TABS
1000.0000 mg | ORAL_TABLET | Freq: Once | ORAL | Status: AC
  Administered 2019-06-28: 1000 mg via ORAL
  Filled 2019-06-28: qty 2

## 2019-06-28 MED ORDER — FENTANYL CITRATE (PF) 100 MCG/2ML IJ SOLN
INTRAMUSCULAR | Status: AC
  Filled 2019-06-28: qty 2

## 2019-06-28 MED ORDER — CEFAZOLIN SODIUM-DEXTROSE 2-4 GM/100ML-% IV SOLN
2.0000 g | INTRAVENOUS | Status: AC
  Administered 2019-06-28: 2 g via INTRAVENOUS
  Filled 2019-06-28: qty 100

## 2019-06-28 MED ORDER — DEXAMETHASONE SODIUM PHOSPHATE 10 MG/ML IJ SOLN
10.0000 mg | Freq: Once | INTRAMUSCULAR | Status: AC
  Administered 2019-06-29: 10 mg via INTRAVENOUS
  Filled 2019-06-28: qty 1

## 2019-06-28 MED ORDER — OXYCODONE HCL 5 MG PO TABS
5.0000 mg | ORAL_TABLET | ORAL | Status: DC | PRN
  Administered 2019-06-28 (×2): 10 mg via ORAL
  Filled 2019-06-28: qty 2

## 2019-06-28 MED ORDER — ALLOPURINOL 100 MG PO TABS
100.0000 mg | ORAL_TABLET | Freq: Every day | ORAL | Status: DC
  Administered 2019-06-29: 100 mg via ORAL
  Filled 2019-06-28: qty 1

## 2019-06-28 MED ORDER — EPHEDRINE SULFATE 50 MG/ML IJ SOLN
INTRAMUSCULAR | Status: DC | PRN
  Administered 2019-06-28 (×2): 5 mg via INTRAVENOUS
  Administered 2019-06-28: 10 mg via INTRAVENOUS

## 2019-06-28 MED ORDER — ONDANSETRON HCL 4 MG/2ML IJ SOLN
INTRAMUSCULAR | Status: AC
  Filled 2019-06-28: qty 2

## 2019-06-28 MED ORDER — ORAL CARE MOUTH RINSE: 15.0000 mL | Freq: Once | OROMUCOSAL | Status: AC

## 2019-06-28 MED ORDER — MIDAZOLAM HCL 5 MG/5ML IJ SOLN
INTRAMUSCULAR | Status: DC | PRN
  Administered 2019-06-28: 2 mg via INTRAVENOUS

## 2019-06-28 MED ORDER — LACTATED RINGERS IV SOLN: INTRAVENOUS | Status: DC

## 2019-06-28 MED ORDER — DIPHENHYDRAMINE HCL 12.5 MG/5ML PO ELIX: 25.0000 mg | ORAL_SOLUTION | ORAL | Status: DC | PRN

## 2019-06-28 MED ORDER — OXYCODONE HCL ER 10 MG PO T12A
10.0000 mg | EXTENDED_RELEASE_TABLET | Freq: Two times a day (BID) | ORAL | Status: DC
  Administered 2019-06-28 – 2019-06-29 (×2): 10 mg via ORAL
  Filled 2019-06-28 (×2): qty 1

## 2019-06-28 MED ORDER — CELECOXIB 200 MG PO CAPS
200.0000 mg | ORAL_CAPSULE | Freq: Once | ORAL | Status: AC
  Administered 2019-06-28: 200 mg via ORAL
  Filled 2019-06-28: qty 1

## 2019-06-28 MED ORDER — SODIUM CHLORIDE 0.9 % IV SOLN: INTRAVENOUS | Status: DC

## 2019-06-28 MED ORDER — LIDOCAINE HCL (CARDIAC) PF 50 MG/5ML IV SOSY
PREFILLED_SYRINGE | INTRAVENOUS | Status: DC | PRN
  Administered 2019-06-28: 40 mg via INTRAVENOUS

## 2019-06-28 MED ORDER — MENTHOL 3 MG MT LOZG: 1.0000 | LOZENGE | OROMUCOSAL | Status: DC | PRN

## 2019-06-28 MED ORDER — PROPOFOL 500 MG/50ML IV EMUL
INTRAVENOUS | Status: DC | PRN
  Administered 2019-06-28: 50 ug/kg/min via INTRAVENOUS

## 2019-06-28 SURGICAL SUPPLY — 64 items
BAG DECANTER FOR FLEXI CONT (MISCELLANEOUS) ×2 IMPLANT
CELLS DAT CNTRL 66122 CELL SVR (MISCELLANEOUS) IMPLANT
CLSR STERI-STRIP ANTIMIC 1/2X4 (GAUZE/BANDAGES/DRESSINGS) ×1 IMPLANT
COVER PERINEAL POST (MISCELLANEOUS) ×2 IMPLANT
COVER SURGICAL LIGHT HANDLE (MISCELLANEOUS) ×2 IMPLANT
COVER WAND RF STERILE (DRAPES) ×2 IMPLANT
CUP ACET PNNCL SECTR W/GRIP 56 (Hips) IMPLANT
DRAPE C-ARM 42X72 X-RAY (DRAPES) ×2 IMPLANT
DRAPE POUCH INSTRU U-SHP 10X18 (DRAPES) ×2 IMPLANT
DRAPE STERI IOBAN 125X83 (DRAPES) ×2 IMPLANT
DRAPE U-SHAPE 47X51 STRL (DRAPES) ×4 IMPLANT
DRSG AQUACEL AG ADV 3.5X10 (GAUZE/BANDAGES/DRESSINGS) ×2 IMPLANT
DURAPREP 26ML APPLICATOR (WOUND CARE) ×4 IMPLANT
ELECT BLADE 4.0 EZ CLEAN MEGAD (MISCELLANEOUS) ×2
ELECT REM PT RETURN 9FT ADLT (ELECTROSURGICAL) ×2
ELECTRODE BLDE 4.0 EZ CLN MEGD (MISCELLANEOUS) ×1 IMPLANT
ELECTRODE REM PT RTRN 9FT ADLT (ELECTROSURGICAL) ×1 IMPLANT
GLOVE BIOGEL PI IND STRL 7.0 (GLOVE) ×1 IMPLANT
GLOVE BIOGEL PI INDICATOR 7.0 (GLOVE) ×1
GLOVE ECLIPSE 7.0 STRL STRAW (GLOVE) ×4 IMPLANT
GLOVE SKINSENSE NS SZ7.5 (GLOVE) ×1
GLOVE SKINSENSE STRL SZ7.5 (GLOVE) ×1 IMPLANT
GLOVE SURG SYN 7.5  E (GLOVE) ×4
GLOVE SURG SYN 7.5 E (GLOVE) ×4 IMPLANT
GLOVE SURG SYN 7.5 PF PI (GLOVE) ×4 IMPLANT
GOWN STRL REIN XL XLG (GOWN DISPOSABLE) ×2 IMPLANT
GOWN STRL REUS W/ TWL LRG LVL3 (GOWN DISPOSABLE) IMPLANT
GOWN STRL REUS W/ TWL XL LVL3 (GOWN DISPOSABLE) ×1 IMPLANT
GOWN STRL REUS W/TWL LRG LVL3 (GOWN DISPOSABLE)
GOWN STRL REUS W/TWL XL LVL3 (GOWN DISPOSABLE) ×2
HANDPIECE INTERPULSE COAX TIP (DISPOSABLE) ×1
HEAD CERAMIC 36 PLUS5 (Hips) ×1 IMPLANT
HOOD PEEL AWAY FLYTE STAYCOOL (MISCELLANEOUS) ×4 IMPLANT
IV NS IRRIG 3000ML ARTHROMATIC (IV SOLUTION) ×2 IMPLANT
JET LAVAGE IRRISEPT WOUND (IRRIGATION / IRRIGATOR) ×4
KIT BASIN OR (CUSTOM PROCEDURE TRAY) ×2 IMPLANT
LAVAGE JET IRRISEPT WOUND (IRRIGATION / IRRIGATOR) ×1 IMPLANT
MARKER SKIN DUAL TIP RULER LAB (MISCELLANEOUS) ×2 IMPLANT
NDL SPNL 18GX3.5 QUINCKE PK (NEEDLE) ×1 IMPLANT
NEEDLE SPNL 18GX3.5 QUINCKE PK (NEEDLE) ×2 IMPLANT
PACK TOTAL JOINT (CUSTOM PROCEDURE TRAY) ×2 IMPLANT
PACK UNIVERSAL I (CUSTOM PROCEDURE TRAY) ×2 IMPLANT
PINN SECTOR W/GRIP ACE CUP 56 (Hips) ×2 IMPLANT
PINNACLE ALTRX PLUS 4 N 36X56 (Hips) ×1 IMPLANT
RETRACTOR WND ALEXIS 18 MED (MISCELLANEOUS) IMPLANT
RTRCTR WOUND ALEXIS 18CM MED (MISCELLANEOUS)
SAW OSC TIP CART 19.5X105X1.3 (SAW) ×2 IMPLANT
SCREW 6.5MMX25MM (Screw) ×1 IMPLANT
SET HNDPC FAN SPRY TIP SCT (DISPOSABLE) ×1 IMPLANT
STAPLER VISISTAT 35W (STAPLE) IMPLANT
STEM FEMORAL SZ5 HIGH ACTIS (Stem) ×1 IMPLANT
SUT ETHIBOND 2 V 37 (SUTURE) ×2 IMPLANT
SUT VIC AB 0 CT1 27 (SUTURE) ×2
SUT VIC AB 0 CT1 27XBRD ANBCTR (SUTURE) ×1 IMPLANT
SUT VIC AB 1 CTX 36 (SUTURE) ×2
SUT VIC AB 1 CTX36XBRD ANBCTR (SUTURE) ×1 IMPLANT
SUT VIC AB 2-0 CT1 27 (SUTURE) ×4
SUT VIC AB 2-0 CT1 TAPERPNT 27 (SUTURE) ×2 IMPLANT
SYR 50ML LL SCALE MARK (SYRINGE) ×2 IMPLANT
TOWEL GREEN STERILE (TOWEL DISPOSABLE) ×2 IMPLANT
TRAY CATH 16FR W/PLASTIC CATH (SET/KITS/TRAYS/PACK) IMPLANT
TRAY FOLEY W/BAG SLVR 16FR (SET/KITS/TRAYS/PACK) ×1
TRAY FOLEY W/BAG SLVR 16FR ST (SET/KITS/TRAYS/PACK) ×1 IMPLANT
YANKAUER SUCT BULB TIP NO VENT (SUCTIONS) ×2 IMPLANT

## 2019-06-28 NOTE — Plan of Care (Signed)
  Problem: Education: Goal: Knowledge of General Education information will improve Description: Including pain rating scale, medication(s)/side effects and non-pharmacologic comfort measures Outcome: Progressing   Problem: Clinical Measurements: Goal: Respiratory complications will improve Outcome: Progressing Note: On room air   Problem: Nutrition: Goal: Adequate nutrition will be maintained Outcome: Progressing   Problem: Coping: Goal: Level of anxiety will decrease Outcome: Progressing   Problem: Pain Managment: Goal: General experience of comfort will improve Outcome: Progressing   Problem: Safety: Goal: Ability to remain free from injury will improve Outcome: Progressing   

## 2019-06-28 NOTE — Transfer of Care (Signed)
Immediate Anesthesia Transfer of Care Note  Patient: Derrick Barr  Procedure(s) Performed: LEFT TOTAL HIP ARTHROPLASTY ANTERIOR APPROACH (Left Hip)  Patient Location: PACU  Anesthesia Type:Spinal  Level of Consciousness: awake and patient cooperative  Airway & Oxygen Therapy: Patient Spontanous Breathing and Patient connected to face mask oxygen  Post-op Assessment: Report given to RN and Post -op Vital signs reviewed and stable  Post vital signs: Reviewed and stable  Last Vitals:  Vitals Value Taken Time  BP 107/71 06/28/19 1534  Temp    Pulse 71 06/28/19 1537  Resp 19 06/28/19 1537  SpO2 94 % 06/28/19 1537  Vitals shown include unvalidated device data.  Last Pain:  Vitals:   06/28/19 1120  TempSrc: Oral  PainSc:       Patients Stated Pain Goal: 4 (06/28/19 1107)  Complications: No apparent anesthesia complications

## 2019-06-28 NOTE — Anesthesia Preprocedure Evaluation (Signed)
Anesthesia Evaluation  Patient identified by MRN, date of birth, ID band Patient awake    Reviewed: Allergy & Precautions, NPO status , Patient's Chart, lab work & pertinent test results  Airway Mallampati: II  TM Distance: >3 FB Neck ROM: Full    Dental  (+) Dental Advisory Given   Pulmonary neg pulmonary ROS,    breath sounds clear to auscultation       Cardiovascular negative cardio ROS   Rhythm:Regular Rate:Normal     Neuro/Psych negative neurological ROS     GI/Hepatic negative GI ROS, Neg liver ROS,   Endo/Other  negative endocrine ROS  Renal/GU negative Renal ROS     Musculoskeletal  (+) Arthritis ,   Abdominal   Peds  Hematology negative hematology ROS (+)   Anesthesia Other Findings   Reproductive/Obstetrics                             Lab Results  Component Value Date   WBC 11.1 (H) 06/24/2019   HGB 16.5 06/24/2019   HCT 49.6 06/24/2019   MCV 90.8 06/24/2019   PLT 287 06/24/2019   Lab Results  Component Value Date   CREATININE 1.02 06/24/2019   BUN 7 06/24/2019   NA 139 06/24/2019   K 4.0 06/24/2019   CL 101 06/24/2019   CO2 29 06/24/2019    Anesthesia Physical Anesthesia Plan  ASA: II  Anesthesia Plan: Spinal   Post-op Pain Management:    Induction:   PONV Risk Score and Plan: 1 and Propofol infusion, Ondansetron and Treatment may vary due to age or medical condition  Airway Management Planned: Natural Airway and Simple Face Mask  Additional Equipment: None  Intra-op Plan:   Post-operative Plan:   Informed Consent: I have reviewed the patients History and Physical, chart, labs and discussed the procedure including the risks, benefits and alternatives for the proposed anesthesia with the patient or authorized representative who has indicated his/her understanding and acceptance.       Plan Discussed with: CRNA  Anesthesia Plan Comments:          Anesthesia Quick Evaluation

## 2019-06-28 NOTE — Anesthesia Procedure Notes (Signed)
Spinal  Patient location during procedure: OR Start time: 06/28/2019 12:45 PM End time: 06/28/2019 12:51 PM Staffing Performed: anesthesiologist  Anesthesiologist: Marcene Duos, MD Preanesthetic Checklist Completed: patient identified, IV checked, site marked, risks and benefits discussed, surgical consent, monitors and equipment checked, pre-op evaluation and timeout performed Spinal Block Patient position: sitting Prep: DuraPrep Patient monitoring: heart rate, cardiac monitor, continuous pulse ox and blood pressure Approach: midline Location: L4-5 Injection technique: single-shot Needle Needle type: Pencan  Needle gauge: 24 G Needle length: 9 cm Assessment Sensory level: T4

## 2019-06-28 NOTE — Op Note (Signed)
LEFT TOTAL HIP ARTHROPLASTY ANTERIOR APPROACH  Procedure Note Derrick Barr   [id number]  Pre-op Diagnosis: left hip degenerative joint disease     Post-op Diagnosis: same   Operative Procedures  1. Total hip replacement; Left hip; uncemented [id number]   Personnel  Surgeon(s): Leandrew Koyanagi, MD  Assist: Laure Kidney, RNFA   Anesthesia: spinal, local  Prosthesis: Depuy Acetabulum: Pinnacle 56 mm Femur: Actis 5 HO Head: 36 mm size: +5 Liner: +4 neutral Bearing Type: ceramic on poly  Total Hip Arthroplasty (Anterior Approach) Op Note:  After informed consent was obtained and the operative extremity marked in the holding area, the patient was brought back to the operating room and placed supine on the HANA table. Next, the operative extremity was prepped and draped in normal sterile fashion. Surgical timeout occurred verifying patient identification, surgical site, surgical procedure and administration of antibiotics.  A modified anterior Smith-Peterson approach to the hip was performed, using the interval between tensor fascia lata and sartorius.  Dissection was carried bluntly down onto the anterior hip capsule. The lateral femoral circumflex vessels were identified and coagulated. A capsulotomy was performed and the capsular flaps tagged for later repair.  The neck osteotomy was performed. The femoral head was removed, the acetabular rim was cleared of soft tissue and attention was turned to reaming the acetabulum.  Sequential reaming was performed under fluoroscopic guidance. We reamed to a size 55 mm, and then impacted the acetabular shell. A 25 mm cancellous screw was placed through the shell for added fixation.  The lateralized liner was then placed after irrigation and attention turned to the femur.  After placing the femoral hook, the leg was taken to externally rotated, extended and adducted position taking care to perform soft tissue releases to allow for adequate  mobilization of the femur. Soft tissue was cleared from the shoulder of the greater trochanter and the hook elevator used to improve exposure of the proximal femur. Sequential broaching performed up to a size 5. Trial neck and head were placed. The leg was brought back up to neutral and the construct reduced.  Antibiotic irrigation was placed in the surgical wound and kept for at least 1 minute.  The position and sizing of components, offset and leg lengths were checked using fluoroscopy. Stability of the construct was checked in extension and external rotation without any subluxation or impingement of prosthesis. We dislocated the prosthesis, dropped the leg back into position, removed trial components, and irrigated copiously. The final stem and head was then placed, the leg brought back up, the system reduced and fluoroscopy used to verify positioning.  We irrigated, obtained hemostasis and closed the capsule using #2 ethibond suture.  One gram of vancomycin powder was placed in the surgical bed. The fascia was closed with #1 vicryl plus, the deep fat layer was closed with 0 vicryl, the subcutaneous layers closed with 2.0 Vicryl Plus and the skin closed with 3.0 monocryl and steri strips. A sterile dressing was applied. The patient was awakened in the operating room and taken to recovery in stable condition.  All sponge, needle, and instrument counts were correct at the end of the case.   Position: supine  Complications: see description of procedure.  Time Out: performed   Drains/Packing: none  Estimated blood loss: see anesthesia record  Returned to Recovery Room: in good condition.   Antibiotics: yes   Mechanical VTE (DVT) Prophylaxis: sequential compression devices, TED thigh-high  Chemical VTE (DVT) Prophylaxis: aspirin  Fluid Replacement: see anesthesia record  Specimens Removed: 1 to pathology   Sponge and Instrument Count Correct? yes   PACU: portable radiograph - low AP    Plan/RTC: Return in 2 weeks for staple removal. Weight Bearing/Load Lower Extremity: full  Hip precautions: none Suture Removal: 2 weeks   N. Glee Arvin, MD Aldean Baker 2:38 PM   Implant Name Type Inv. Item Serial No. Manufacturer Lot No. LRB No. Used Action  PINN SECTOR W/GRIP ACE CUP 56 - [id number] Hips PINN SECTOR W/GRIP ACE CUP 56  DEPUY SYNTHES [id number] Left 1 Implanted  SCREW 6.5MMX25MM - [id number] Screw SCREW 6.5MMX25MM  DEPUY SYNTHES [id number] Left 1 Implanted  PINNACLE ALTRX PLUS 4 N 36X56 - [id number] Hips PINNACLE ALTRX PLUS 4 N 36X56  DEPUY SYNTHES [id number] Left 1 Implanted  STEM FEMORAL SZ5 HIGH ACTIS - [id number] Stem STEM FEMORAL SZ5 HIGH ACTIS  DEPUY ORTHOPAEDICS [id number] Left 1 Implanted  HEAD CERAMIC 36 PLUS5 - [id number] Hips HEAD CERAMIC 36 PLUS5  DEPUY SYNTHES [id number] Left 1 Implanted
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
## 2019-06-29 ENCOUNTER — Encounter: Payer: Self-pay | Admitting: *Deleted

## 2019-06-29 DIAGNOSIS — M1612 Unilateral primary osteoarthritis, left hip: Secondary | ICD-10-CM | POA: Diagnosis not present

## 2019-06-29 LAB — BASIC METABOLIC PANEL
Anion gap: 9 (ref 5–15)
BUN: 8 mg/dL (ref 6–20)
CO2: 27 mmol/L (ref 22–32)
Calcium: 8.5 mg/dL — ABNORMAL LOW (ref 8.9–10.3)
Chloride: 101 mmol/L (ref 98–111)
Creatinine, Ser: 1.13 mg/dL (ref 0.61–1.24)
GFR calc Af Amer: >60 mL/min (ref >60)
GFR calc non Af Amer: >60 mL/min (ref >60)
Glucose, Bld: 127 mg/dL — ABNORMAL HIGH (ref 70–99)
Potassium: 4.3 mmol/L (ref 3.5–5.1)
Sodium: 137 mmol/L (ref 135–145)

## 2019-06-29 LAB — CBC
HCT: 38.3 % — ABNORMAL LOW (ref 39.0–52.0)
Hemoglobin: 12.6 g/dL — ABNORMAL LOW (ref 13.0–17.0)
MCH: 30.2 pg (ref 26.0–34.0)
MCHC: 32.9 g/dL (ref 30.0–36.0)
MCV: 91.8 fL (ref 80.0–100.0)
Platelets: 223 10*3/uL (ref 150–400)
RBC: 4.17 MIL/uL — ABNORMAL LOW (ref 4.22–5.81)
RDW: 13.8 % (ref 11.5–15.5)
WBC: 14.2 10*3/uL — ABNORMAL HIGH (ref 4.0–10.5)
nRBC: 0 % (ref 0.0–0.2)

## 2019-06-29 NOTE — Progress Notes (Signed)
Physical Therapy Treatment and discharge Patient Details Name: Derrick Barr MRN: [id number] DOB: 1970/01/26 Today's Date: 06/29/2019    History of Present Illness Pt is 49 yo male with h/o arthritis, gout, depression, kidney stones who presents for L THA, anterior approach.     PT Comments    Pt ambulated 500' with RW and practiced stairs again. Mod I with mobility. Discussed car transfer and activity level upon return home. Given physical nature of pt's career, outpt PT would be a good idea prior to return to work. No further acute PT needs at this time.    Follow Up Recommendations  Follow surgeon's recommendation for DC plan and follow-up therapies     Equipment Recommendations  Rolling walker with 5" wheels;3in1 (PT)    Recommendations for Other Services       Precautions / Restrictions Precautions Precautions: Fall Restrictions Weight Bearing Restrictions: Yes LLE Weight Bearing: Weight bearing as tolerated    Mobility  Bed Mobility               General bed mobility comments: pt in chair  Transfers Overall transfer level: Modified independent Equipment used: Rolling walker (2 wheeled) Transfers: Sit to/from Stand Sit to Stand: Modified independent (Device/Increase time)         General transfer comment: pt stood from recliner and toilet with safe hand placement and no physical assist needed  Ambulation/Gait Ambulation/Gait assistance: Modified independent (Device/Increase time) Gait Distance (Feet): 500 Feet Assistive device: Rolling walker (2 wheeled) Gait Pattern/deviations: Step-through pattern;Decreased stride length;Antalgic Gait velocity: decreased Gait velocity interpretation: 1.31 - 2.62 ft/sec, indicative of limited community ambulator General Gait Details: encouraged to allow L knee bend with swing through   Stairs Stairs: Yes Stairs assistance: Supervision Stair Management: Two rails;Step to pattern;Forwards Number of Stairs:  5 General stair comments: pt feeling comfortable on stairs after second round of practice   Wheelchair Mobility    Modified Rankin (Stroke Patients Only)       Balance Overall balance assessment: No apparent balance deficits (not formally assessed)                                          Cognition Arousal/Alertness: Awake/alert Behavior During Therapy: WFL for tasks assessed/performed Overall Cognitive Status: Within Functional Limits for tasks assessed                                        Exercises Total Joint Exercises Long Arc Quad: AROM;Left;10 reps;Seated    General Comments General comments (skin integrity, edema, etc.): discussed car transfer      Pertinent Vitals/Pain Pain Assessment: 0-10 Pain Score: 3  Pain Location: L hip Pain Descriptors / Indicators: Operative site guarding;Sore Pain Intervention(s): Limited activity within patient's tolerance;Monitored during session    Home Living                      Prior Function            PT Goals (current goals can now be found in the care plan section) Acute Rehab PT Goals Patient Stated Goal: return to home and work PT Goal Formulation: With patient Time For Goal Achievement: 07/06/19 Potential to Achieve Goals: Good Progress towards PT goals: Goals met/education completed, patient discharged from PT  Frequency    7X/week      PT Plan Current plan remains appropriate    Co-evaluation              AM-PAC PT "6 Clicks" Mobility   Outcome Measure  Help needed turning from your back to your side while in a flat bed without using bedrails?: None Help needed moving from lying on your back to sitting on the side of a flat bed without using bedrails?: None Help needed moving to and from a bed to a chair (including a wheelchair)?: None Help needed standing up from a chair using your arms (e.g., wheelchair or bedside chair)?: None Help needed to  walk in hospital room?: None Help needed climbing 3-5 steps with a railing? : None 6 Click Score: 24    End of Session   Activity Tolerance: Patient tolerated treatment well Patient left: in chair;with call bell/phone within reach;with chair alarm set Nurse Communication: Mobility status PT Visit Diagnosis: Pain;Other abnormalities of gait and mobility (R26.89) Pain - Right/Left: Left Pain - part of body: Hip     Time: 0712-1975 PT Time Calculation (min) (ACUTE ONLY): 19 min  Charges:  $Gait Training: 8-22 mins                     Leighton Roach, [place]  Pager [phone] Office [place] 06/29/2019, 2:21 PM

## 2019-06-29 NOTE — Progress Notes (Signed)
   Subjective:  Patient reports pain as mild.  Objective:   VITALS:   Vitals:   06/28/19 1830 06/28/19 2037 06/29/19 0011 06/29/19 0421  BP: 115/72 111/76 120/75 110/67  Pulse: 69 73 70 61  Resp: 15 18 17 18   Temp: 98.1 F (36.7 C) 98.2 F (36.8 C) 98.3 F (36.8 C) 98.5 F (36.9 C)  TempSrc: Oral Oral Oral Oral  SpO2: 94% 96% 97% 96%  Weight:      Height:        Neurovascular intact Sensation intact distally Intact pulses distally Dorsiflexion/Plantar flexion intact Incision: dressing C/D/I and no drainage No cellulitis present Compartment soft   Lab Results  Component Value Date   WBC 14.2 (H) 06/29/2019   HGB 12.6 (L) 06/29/2019   HCT 38.3 (L) 06/29/2019   MCV 91.8 06/29/2019   PLT 223 06/29/2019     Assessment/Plan:  1 Day Post-Op   - Expected postop acute blood loss anemia - will monitor for symptoms - Up with PT/OT - DVT ppx - SCDs, ambulation, aspirin - WBAT operative extremity - Pain control - Discharge planning - anticipate home today after 2 sessions of PT  08/29/2019 06/29/2019, 7:32 AM [phone]

## 2019-06-29 NOTE — TOC Initial Note (Signed)
Transition of Care [facility]) - Initial/Assessment Note    Patient Details  Name: Derrick Barr MRN: [id number] Date of Birth: 09-12-1970  [facility] CM/SW Contact:    Bess Kinds, RN Phone Number: [phone] 06/29/2019, 11:23 AM  Clinical Narrative:                  Spoke with patient at the bedside. He will transition home with his fiance who lives in a one level home. States either his mom or fiance will provide transportation home. DME delivered to room. Pending referral to [facility] for HH PT. TOC following for transition needs.     Barriers to Discharge: No Barriers Identified   Patient Goals and CMS Choice Patient states their goals for this hospitalization and ongoing recovery are:: return home with fiance CMS Medicare.gov Compare Post Acute Care list provided to:: Patient Choice offered to / list presented to : Patient  Expected Discharge Plan and Services           Expected Discharge Date: 06/29/19               DME Arranged: 3-N-1, Walker rolling DME Agency: [employer] Date DME Agency Contacted: 06/29/19 Time DME Agency Contacted: [phone] Representative spoke with at DME Agency: [facility] HH Arranged: PT          Prior Living Arrangements/Services                       Activities of Daily Living Home Assistive Devices/Equipment: Grab bars in shower ADL Screening (condition at time of admission) Patient's cognitive ability adequate to safely complete daily activities?: Yes Is the patient deaf or have difficulty hearing?: No Does the patient have difficulty seeing, even when wearing glasses/contacts?: No Does the patient have difficulty concentrating, remembering, or making decisions?: No Patient able to express need for assistance with ADLs?: Yes Does the patient have difficulty dressing or bathing?: No Independently performs ADLs?: Yes (appropriate for developmental age) Does the patient have difficulty walking or climbing stairs?: Yes Weakness  of Legs: Left Weakness of Arms/Hands: None  Permission Sought/Granted                  Emotional Assessment              Admission diagnosis:  DJD (degenerative joint disease) [M19.90] Status post total replacement of left hip [Z96.642] Patient Active Problem List   Diagnosis Date Noted  . DJD (degenerative joint disease) 06/28/2019  . Status post total replacement of left hip 06/28/2019  . Unilateral primary osteoarthritis, left hip 06/01/2019  . Acute idiopathic gout of left knee 03/10/2019  . Depression (emotion) 01/01/2015   PCP:  Patient, [name] Pharmacy:   [facility] [address], [place] - [address] [place] [address] Phone: [phone] Fax: [phone]  [id number] - [place], [place] - [address] AT [facility] [address] [place] [address] Phone: [phone] Fax: [phone]     Social Determinants of Health (SDOH) Interventions    Readmission Risk Interventions No flowsheet data found.

## 2019-06-29 NOTE — Progress Notes (Signed)
Alto [place] to be discharged home per MD order.  Discussed prescriptions and home health, post op activity, and follow up appointments with the patient. Prescriptions were sent previously to patient's preferred pharmacy prior to admission, medication list explained in detail. Pt verbalized understanding.  Allergies as of 06/29/2019   No Known Allergies     Medication List    TAKE these medications   allopurinol 100 MG tablet Commonly known as: ZYLOPRIM TAKE 1 TABLET TWICE A DAY What changed: when to take this   aspirin EC 81 MG tablet Take 1 tablet (81 mg total) by mouth 2 (two) times daily.   methocarbamol 750 MG tablet Commonly known as: ROBAXIN Take 1 tablet (750 mg total) by mouth 2 (two) times daily as needed for muscle spasms.   ondansetron 4 MG disintegrating tablet Commonly known as: Zofran ODT Take 1 tablet (4 mg total) by mouth every 8 (eight) hours as needed for nausea or vomiting.   ondansetron 4 MG tablet Commonly known as: ZOFRAN Take 1-2 tablets (4-8 mg total) by mouth every 8 (eight) hours as needed for nausea or vomiting.   oxyCODONE HCl 7.5 MG Tabs Take 7.5 mg by mouth 3 (three) times daily as needed.   oxyCODONE 10 mg 12 hr tablet Commonly known as: OXYCONTIN Take 1 tablet (10 mg total) by mouth every 12 (twelve) hours for 3 days.   oxyCODONE-acetaminophen 5-325 MG tablet Commonly known as: Percocet Take 1-2 tablets by mouth every 8 (eight) hours as needed for severe pain.   senna-docusate 8.6-50 MG tablet Commonly known as: Senokot S Take 1-2 tablets by mouth at bedtime as needed.   sertraline 50 MG tablet Commonly known as: ZOLOFT Take 50 mg by mouth daily.   traZODone 50 MG tablet Commonly known as: DESYREL Take 50 mg by mouth at bedtime as needed for sleep.   Voltaren 1 % Gel Generic drug: diclofenac Sodium Apply 2 g topically 2 (two) times daily as needed (joint pain).            Durable Medical Equipment  (From admission,  onward)         Start     Ordered   06/28/19 1819  DME Walker rolling  Once    Question:  Patient needs a walker to treat with the following condition  Answer:  History of hip replacement   06/28/19 1818   06/28/19 1819  DME 3 n 1  Once     06/28/19 1818   06/28/19 1819  DME Bedside commode  Once    Question:  Patient needs a bedside commode to treat with the following condition  Answer:  History of hip replacement   06/28/19 1818          Vitals:   06/29/19 0421 06/29/19 0752  BP: 110/67 115/64  Pulse: 61 63  Resp: 18 16  Temp: 98.5 F (36.9 C) (!) 97.4 F (36.3 C)  SpO2: 96% 94%    IV catheter discontinued, Dressing and pressure applied. Pt denies pain at this time. No complaints noted. Rolling walker and 3-n-1 commode delivered to bedside.  An After Visit Summary was printed and given to the patient. Patient escorted via [facility], and discharged home via private auto with fiance.   Johnette Abraham [facility] 06/29/2019 3:22 PM

## 2019-06-29 NOTE — Discharge Summary (Signed)
Patient ID: Derrick Barr MRN: [id number] DOB/AGE: May 09, 1970 49 y.o.  Admit date: 06/28/2019 Discharge date: 06/29/2019  Admission Diagnoses:  Unilateral primary osteoarthritis, left hip  Discharge Diagnoses:  Principal Problem:   Unilateral primary osteoarthritis, left hip Active Problems:   DJD (degenerative joint disease)   Status post total replacement of left hip   Past Medical History:  Diagnosis Date   Anxiety    Arthritis    Gout   Depression    Gout    History of kidney stones    Pneumonia    as a child    Surgeries: Procedure(s): LEFT TOTAL HIP ARTHROPLASTY ANTERIOR APPROACH on 06/28/2019   Consultants (if any):   Discharged Condition: Improved  Hospital Course: Derrick Barr is an 49 y.o. male who was admitted 06/28/2019 with a diagnosis of Unilateral primary osteoarthritis, left hip and went to the operating room on 06/28/2019 and underwent the above named procedures.    He was given perioperative antibiotics:  Anti-infectives (From admission, onward)   Start     Dose/Rate Route Frequency Ordered Stop   06/28/19 1900  ceFAZolin (ANCEF) IVPB 2g/100 mL premix     2 g 200 mL/hr over 30 Minutes Intravenous Every 6 hours 06/28/19 1818 06/29/19 0624   06/28/19 1045  ceFAZolin (ANCEF) IVPB 2g/100 mL premix     2 g 200 mL/hr over 30 Minutes Intravenous On call to O.R. 06/28/19 1043 06/28/19 1306    .  He was given sequential compression devices, early ambulation, and appropriate chemoprophylaxis for DVT prophylaxis.  He benefited maximally from the hospital stay and there were no complications.    Recent vital signs:  Vitals:   06/29/19 0011 06/29/19 0421  BP: 120/75 110/67  Pulse: 70 61  Resp: 17 18  Temp: 98.3 F (36.8 C) 98.5 F (36.9 C)  SpO2: 97% 96%    Recent laboratory studies:  Lab Results  Component Value Date   HGB 12.6 (L) 06/29/2019   HGB 16.5 06/24/2019   HGB 15.4 03/26/2019   Lab Results  Component Value Date   WBC  14.2 (H) 06/29/2019   PLT 223 06/29/2019   Lab Results  Component Value Date   INR 1.0 06/24/2019   Lab Results  Component Value Date   NA 137 06/29/2019   K 4.3 06/29/2019   CL 101 06/29/2019   CO2 27 06/29/2019   BUN 8 06/29/2019   CREATININE 1.13 06/29/2019   GLUCOSE 127 (H) 06/29/2019    Discharge Medications:   Allergies as of 06/29/2019   No Known Allergies     Medication List    TAKE these medications   allopurinol 100 MG tablet Commonly known as: ZYLOPRIM TAKE 1 TABLET TWICE A DAY What changed: when to take this   aspirin EC 81 MG tablet Take 1 tablet (81 mg total) by mouth 2 (two) times daily.   methocarbamol 750 MG tablet Commonly known as: ROBAXIN Take 1 tablet (750 mg total) by mouth 2 (two) times daily as needed for muscle spasms.   ondansetron 4 MG disintegrating tablet Commonly known as: Zofran ODT Take 1 tablet (4 mg total) by mouth every 8 (eight) hours as needed for nausea or vomiting.   ondansetron 4 MG tablet Commonly known as: ZOFRAN Take 1-2 tablets (4-8 mg total) by mouth every 8 (eight) hours as needed for nausea or vomiting.   oxyCODONE HCl 7.5 MG Tabs Take 7.5 mg by mouth 3 (three) times daily as needed.  oxyCODONE 10 mg 12 hr tablet Commonly known as: OXYCONTIN Take 1 tablet (10 mg total) by mouth every 12 (twelve) hours for 3 days.   oxyCODONE-acetaminophen 5-325 MG tablet Commonly known as: Percocet Take 1-2 tablets by mouth every 8 (eight) hours as needed for severe pain.   senna-docusate 8.6-50 MG tablet Commonly known as: Senokot S Take 1-2 tablets by mouth at bedtime as needed.   sertraline 50 MG tablet Commonly known as: ZOLOFT Take 50 mg by mouth daily.   traZODone 50 MG tablet Commonly known as: DESYREL Take 50 mg by mouth at bedtime as needed for sleep.   Voltaren 1 % Gel Generic drug: diclofenac Sodium Apply 2 g topically 2 (two) times daily as needed (joint pain).            Durable Medical Equipment    (From admission, onward)         Start     Ordered   06/28/19 1819  DME Walker rolling  Once    Question:  Patient needs a walker to treat with the following condition  Answer:  History of hip replacement   06/28/19 1818   06/28/19 1819  DME 3 n 1  Once     06/28/19 1818   06/28/19 1819  DME Bedside commode  Once    Question:  Patient needs a bedside commode to treat with the following condition  Answer:  History of hip replacement   06/28/19 1818          Diagnostic Studies: DG Chest 2 View  Result Date: 06/24/2019 CLINICAL DATA:  Preoperative EXAM: CHEST - 2 VIEW COMPARISON:  None. FINDINGS: The heart size and mediastinal contours are within normal limits. Both lungs are clear. The visualized skeletal structures are unremarkable. IMPRESSION: No acute abnormality of the lungs. Electronically Signed   By: Lauralyn Primes M.D.   On: 06/24/2019 15:11   DG Pelvis Portable  Result Date: 06/28/2019 CLINICAL DATA:  Left hip replacement. EXAM: PORTABLE PELVIS 1-2 VIEWS COMPARISON:  CT abdomen pelvis dated March 26, 2019. FINDINGS: The left hip demonstrates a total arthroplasty without evidence of hardware failure or complication. There is no fracture or dislocation. The alignment is anatomic. Post-surgical changes noted in the surrounding soft tissues. IMPRESSION: 1. Left total hip arthroplasty without evidence of acute postoperative complication. Electronically Signed   By: Obie Dredge M.D.   On: 06/28/2019 17:10   DG C-Arm 1-60 Min  Result Date: 06/28/2019 CLINICAL DATA:  Status post left total hip replacement EXAM: OPERATIVE LEFT HIP (WITH PELVIS IF PERFORMED) 2 VIEWS TECHNIQUE: Fluoroscopic spot image(s) were submitted for interpretation post-operatively. COMPARISON:  None. FLUOROSCOPY TIME:  0 MINUTES 27.1 SECONDS; 4.698 MGY; 5 ACQUIRED IMAGES FINDINGS: Initially, frontal views of each hip joint were performed preoperatively. 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 No fracture or  dislocation. Subsequent images show placement total hip replacement on the left. Prosthetic components on the left appear well-seated on frontal view. No fracture or dislocation. Right hip joint appears essentially normal. IMPRESSION: Status post total hip replacement on the left with prosthetic components well-seated on frontal view. No appreciable fracture or dislocation. Essentially normal appearing right hip joint. Electronically Signed   By: Bretta Bang III M.D.   On: 06/28/2019 16:43   DG HIP OPERATIVE UNILAT W OR W/O PELVIS LEFT  Result Date: 06/28/2019 CLINICAL DATA:  Status post left total hip replacement EXAM: OPERATIVE LEFT HIP (WITH PELVIS IF PERFORMED) 2 VIEWS TECHNIQUE: Fluoroscopic spot image(s) were  submitted for interpretation post-operatively. COMPARISON:  None. FLUOROSCOPY TIME:  0 MINUTES 27.1 SECONDS; 4.698 MGY; 5 ACQUIRED IMAGES FINDINGS: Initially, frontal views of each hip joint were performed preoperatively. 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 No fracture or dislocation. Subsequent images show placement total hip replacement on the left. Prosthetic components on the left appear well-seated on frontal view. No fracture or dislocation. Right hip joint appears essentially normal. IMPRESSION: Status post total hip replacement on the left with prosthetic components well-seated on frontal view. No appreciable fracture or dislocation. Essentially normal appearing right hip joint. Electronically Signed   By: Bretta Bang III M.D.   On: 06/28/2019 16:43    Disposition: Discharge disposition: 01-Home or Self Care       Discharge Instructions    Call MD / Call 911   Complete by: As directed    If you experience chest pain or shortness of breath, CALL 911 and be transported to the hospital emergency room.  If you develope a fever above 101.5 F, pus (white drainage) or increased drainage or redness at the wound, or calf pain, call your surgeon's office.    Constipation Prevention   Complete by: As directed    Drink plenty of fluids.  Prune juice may be helpful.  You may use a stool softener, such as Colace (over the counter) 100 mg twice a day.  Use MiraLax (over the counter) for constipation as needed.   Driving restrictions   Complete by: As directed    No driving while taking narcotic pain meds.   Increase activity slowly as tolerated   Complete by: As directed       Follow-up Information    Tarry Kos, MD In 2 weeks.   Specialty: Orthopedic Surgery Why: For suture removal, For wound re-check Contact information: [address] [place] [address] [phone]            Signed: Glee Arvin 06/29/2019, 7:33 AM

## 2019-06-29 NOTE — TOC Transition Note (Signed)
Transition of Care [facility]) - CM/SW Discharge Note   Patient Details  Name: Derrick Barr MRN: [id number] Date of Birth: 01/04/1971  Transition of Care [facility]) CM/SW Contact:  Bess Kinds, RN Phone Number: [phone] 06/29/2019, 12:38 PM   Clinical Narrative:     Received call back from Tiffany at [facility] - referral accepted with start of care for Thursday or Friday. No further TOC needs identified.   Final next level of care: Home w Home Health Services Barriers to Discharge: No Barriers Identified   Patient Goals and CMS Choice Patient states their goals for this hospitalization and ongoing recovery are:: return home with fiance CMS Medicare.gov Compare Post Acute Care list provided to:: Patient Choice offered to / list presented to : Patient  Discharge Placement                       Discharge Plan and Services                DME Arranged: 3-N-1, Walker rolling DME Agency: [employer] Date DME Agency Contacted: 06/29/19 Time DME Agency Contacted: [phone] Representative spoke with at DME Agency: [facility] Arranged: PT   Date HH Agency Contacted: 06/29/19 Time HH Agency Contacted: [phone] Representative spoke with at [facility] Agency: Tiffany  Social Determinants of Health (SDOH) Interventions     Readmission Risk Interventions No flowsheet data found.

## 2019-06-29 NOTE — Evaluation (Signed)
Physical Therapy Evaluation Patient Details Name: Derrick Barr MRN: [id number] DOB: [birth year] Today's Date: 06/29/2019   History of Present Illness  Pt is 49 yo male with h/o arthritis, gout, depression, kidney stones who presents for L THA, anterior approach.   Clinical Impression  Pt is s/p THA resulting in the deficits listed below (see PT Problem List). Pt mobilizing well today, ambulated 200' with RW and supervision. Pt reports pain as equal to daily pain preceding surgery, 4/10. Pt performed supine, seated, and standing there ex. Will plan to see in PM to progress ambulation before d/c home.  Pt will benefit from skilled PT to increase their independence and safety with mobility to allow discharge to the venue listed below.      Follow Up Recommendations Follow surgeon's recommendation for DC plan and follow-up therapies    Equipment Recommendations  Rolling walker with 5" wheels;3in1 (PT)    Recommendations for Other Services       Precautions / Restrictions Precautions Precautions: Fall Restrictions Weight Bearing Restrictions: Yes LLE Weight Bearing: Weight bearing as tolerated      Mobility  Bed Mobility Overal bed mobility: Needs Assistance Bed Mobility: Rolling;Sidelying to Sit Rolling: Supervision Sidelying to sit: Min guard       General bed mobility comments: vc's for sequencing, supervision to roll L, min-guard with transition to sitting, no physical assist needed  Transfers Overall transfer level: Needs assistance Equipment used: Rolling walker (2 wheeled) Transfers: Sit to/from Stand Sit to Stand: Supervision         General transfer comment: vc's for hand placement  Ambulation/Gait Ambulation/Gait assistance: Supervision Gait Distance (Feet): 200 Feet Assistive device: Rolling walker (2 wheeled) Gait Pattern/deviations: Step-through pattern;Decreased stride length;Antalgic Gait velocity: decreased Gait velocity interpretation: 1.31 - 2.62  ft/sec, indicative of limited community ambulator General Gait Details: mildly antalgic pattern with decreased step length, vc's to allow for L knee flex with swing through  Stairs Stairs: Yes Stairs assistance: Min guard Stair Management: Two rails;Step to pattern;Forwards Number of Stairs: 5 General stair comments: vc's for sequencing  Wheelchair Mobility    Modified Rankin (Stroke Patients Only)       Balance Overall balance assessment: No apparent balance deficits (not formally assessed)                                           Pertinent Vitals/Pain Pain Assessment: 0-10 Pain Score: 4  Pain Location: L hip Pain Descriptors / Indicators: Operative site guarding;Sore Pain Intervention(s): Limited activity within patient's tolerance;Monitored during session    Home Living Family/patient expects to be discharged to:: Private residence Living Arrangements: Alone Available Help at Discharge: Friend(s);Available 24 hours/day Type of Home: Apartment Home Access: Stairs to enter Entrance Stairs-Rails: Left Entrance Stairs-Number of Steps: flight Home Layout: One level Home Equipment: None Additional Comments: pt plans to go to fiance's apt, home info is about it    Prior Function Level of Independence: Independent         Comments: hasn't been able to work since 2/21. 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     Hand Dominance        Extremity/Trunk Assessment   Upper Extremity Assessment Upper Extremity Assessment: Overall WFL for tasks assessed    Lower Extremity Assessment Lower Extremity Assessment: LLE deficits/detail LLE Deficits / Details: hip flex 2+/5, knee ext 3+/5, lacks full hip ext  and abd LLE Sensation: WNL LLE Coordination: WNL    Cervical / Trunk Assessment Cervical / Trunk Assessment: Normal  Communication   Communication: No difficulties  Cognition Arousal/Alertness: Awake/alert Behavior During Therapy: WFL for tasks  assessed/performed Overall Cognitive Status: Within Functional Limits for tasks assessed                                        General Comments General comments (skin integrity, edema, etc.): reviewed positioning and elevation    Exercises Total Joint Exercises Ankle Circles/Pumps: AROM;Both;20 reps;Supine Quad Sets: AROM;Both;10 reps;Supine Heel Slides: AROM;Left;10 reps;Supine Hip ABduction/ADduction: AAROM;Left;10 reps;Supine Straight Leg Raises: AAROM;Left;10 reps;Supine Marching in Standing: AROM;Both;10 reps;Standing Standing Hip Extension: AROM;Left;10 reps;Standing   Assessment/Plan    PT Assessment Patient needs continued PT services  PT Problem List Decreased range of motion;Decreased activity tolerance;Decreased strength;Decreased knowledge of use of DME;Decreased knowledge of precautions;Pain       PT Treatment Interventions DME instruction;Gait training;Stair training;Functional mobility training;Therapeutic activities;Therapeutic exercise;Patient/family education    PT Goals (Current goals can be found in the Care Plan section)  Acute Rehab PT Goals Patient Stated Goal: return to home and work PT Goal Formulation: With patient Time For Goal Achievement: 07/06/19 Potential to Achieve Goals: Good    Frequency 7X/week   Barriers to discharge        Co-evaluation               AM-PAC PT "6 Clicks" Mobility  Outcome Measure Help needed turning from your back to your side while in a flat bed without using bedrails?: None Help needed moving from lying on your back to sitting on the side of a flat bed without using bedrails?: None Help needed moving to and from a bed to a chair (including a wheelchair)?: A Little Help needed standing up from a chair using your arms (e.g., wheelchair or bedside chair)?: A Little Help needed to walk in hospital room?: A Little Help needed climbing 3-5 steps with a railing? : A Little 6 Click Score: 20     End of Session Equipment Utilized During Treatment: Gait belt Activity Tolerance: Patient tolerated treatment well Patient left: in chair;with call bell/phone within reach;with chair alarm set Nurse Communication: Mobility status PT Visit Diagnosis: Pain;Other abnormalities of gait and mobility (R26.89) Pain - Right/Left: Left Pain - part of body: Hip    Time: 2202-5427 PT Time Calculation (min) (ACUTE ONLY): 48 min   Charges:   PT Evaluation $PT Eval Low Complexity: 1 Low PT Treatments $Gait Training: 8-22 mins $Therapeutic Exercise: 8-22 mins        Leighton Roach, [place]  Pager [phone] Office [place] 06/29/2019, 10:26 AM

## 2019-06-29 NOTE — Anesthesia Postprocedure Evaluation (Signed)
Anesthesia Post Note  Patient: Derrick Barr  Procedure(s) Performed: LEFT TOTAL HIP ARTHROPLASTY ANTERIOR APPROACH (Left Hip)     Patient location during evaluation: Other Anesthesia Type: Spinal and MAC Level of consciousness: awake and alert Pain management: pain level controlled Vital Signs Assessment: post-procedure vital signs reviewed and stable Respiratory status: spontaneous breathing, nonlabored ventilation, respiratory function stable and patient connected to nasal cannula oxygen Cardiovascular status: blood pressure returned to baseline and stable Postop Assessment: no apparent nausea or vomiting, spinal receding and patient able to bend at knees Anesthetic complications: no    Last Vitals:  Vitals:   06/29/19 0421 06/29/19 0752  BP: 110/67 115/64  Pulse: 61 63  Resp: 18 16  Temp: 36.9 C (!) 36.3 C  SpO2: 96% 94%    Last Pain:  Vitals:   06/29/19 1034  TempSrc:   PainSc: 3                  [name],W. EDMOND

## 2019-07-06 ENCOUNTER — Ambulatory Visit (INDEPENDENT_AMBULATORY_CARE_PROVIDER_SITE_OTHER): Payer: 59 | Admitting: Orthopaedic Surgery

## 2019-07-06 ENCOUNTER — Ambulatory Visit (INDEPENDENT_AMBULATORY_CARE_PROVIDER_SITE_OTHER): Payer: 59

## 2019-07-06 ENCOUNTER — Encounter: Payer: Self-pay | Admitting: Orthopaedic Surgery

## 2019-07-06 VITALS — Ht 71.0 in | Wt 229.0 lb

## 2019-07-06 DIAGNOSIS — Z96642 Presence of left artificial hip joint: Secondary | ICD-10-CM | POA: Diagnosis not present

## 2019-07-06 DIAGNOSIS — M25561 Pain in right knee: Secondary | ICD-10-CM

## 2019-07-06 MED ORDER — COLCHICINE 0.6 MG PO TABS: 0.6000 mg | ORAL_TABLET | Freq: Two times a day (BID) | ORAL | 0 refills | Status: DC | PRN

## 2019-07-06 MED ORDER — METHYLPREDNISOLONE ACETATE 40 MG/ML IJ SUSP
40.0000 mg | INTRAMUSCULAR | Status: AC | PRN
  Administered 2019-07-06: 40 mg via INTRA_ARTICULAR

## 2019-07-06 MED ORDER — BUPIVACAINE HCL 0.5 % IJ SOLN
2.0000 mL | INTRAMUSCULAR | Status: AC | PRN
Start: 2019-07-06 — End: 2019-07-06
  Administered 2019-07-06: 2 mL via INTRA_ARTICULAR

## 2019-07-06 MED ORDER — LIDOCAINE HCL 1 % IJ SOLN
2.0000 mL | INTRAMUSCULAR | Status: AC | PRN
  Administered 2019-07-06: 2 mL

## 2019-07-06 MED ORDER — HYDROCODONE-ACETAMINOPHEN 5-325 MG PO TABS: 1.0000 | ORAL_TABLET | Freq: Two times a day (BID) | ORAL | 0 refills | Status: DC | PRN

## 2019-07-06 NOTE — Progress Notes (Signed)
Office Visit Note   Patient: Derrick Barr           Date of Birth: May 06, 1970           MRN: [id number] Visit Date: 07/06/2019              Requested by: No referring provider defined for this encounter. PCP: Patient, [name]   Assessment & Plan: Visit Diagnoses:  1. Status post total replacement of left hip   2. Acute pain of right knee     Plan: Impression is left hip flexor strain and right knee gout flareup.  55 cc of yellowish gouty fluid was aspirated from the right knee and cortisone was injected.  I refilled his pain medications today.  Given a prescription for colchicine.  His hip x-rays do not show any issues with the hip implants I think he just likely strained his hip flexor.  He will treat this symptomatically.  Recheck next week for suture removal.  Follow-Up Instructions: Return if symptoms worsen or fail to improve.   Orders:  Orders Placed This Encounter  Procedures  . XR KNEE 3 VIEW RIGHT  . XR HIP UNILAT W OR W/O PELVIS 2-3 VIEWS LEFT  . Cell count + diff,  w/ cryst-synvl fld   Meds ordered this encounter  Medications  . colchicine 0.6 MG tablet    Sig: Take 1 tablet (0.6 mg total) by mouth 2 (two) times daily as needed.    Dispense:  20 tablet    Refill:  0  . HYDROcodone-acetaminophen (NORCO) 5-325 MG tablet    Sig: Take 1-2 tablets by mouth 2 (two) times daily as needed.    Dispense:  30 tablet    Refill:  0      Procedures: Large Joint Inj: R knee on 07/06/2019 11:46 AM Indications: pain Details: 22 G needle  Arthrogram: No  Medications: 40 mg methylPREDNISolone acetate 40 MG/ML; 2 mL lidocaine 1 %; 2 mL bupivacaine 0.5 % Consent was given by the patient. Patient was prepped and draped in the usual sterile fashion.       Clinical Data: No additional findings.   Subjective: Chief Complaint  Patient presents with  . Left Hip - Follow-up    Left total hip arthroplasty DOS 06-28-2019    [name] is a 1 week status post a left  total knee replacement.  He feels that his right knee is flared up with gout which started about 2 days after the surgery.  He takes allopurinol chronically.  He also noticed some increased left hip pain after he try to swat away a spider on his right leg.  He felt a tearing sensation in his left hip when he tried to do this.   Review of Systems  Constitutional: Negative.   All other systems reviewed and are negative.    Objective: Vital Signs: Ht 5\' 11"  (1.803 m)   Wt 229 lb (103.9 kg)   BMI 31.94 kg/m   Physical Exam Vitals and nursing note reviewed.  Constitutional:      Appearance: He is well-developed.  Pulmonary:     Effort: Pulmonary effort is normal.  Abdominal:     Palpations: Abdomen is soft.  Skin:    General: Skin is warm.  Neurological:     Mental Status: He is alert and oriented to person, place, and time.  Psychiatric:        Behavior: Behavior normal.        Thought  Content: Thought content normal.        Judgment: Judgment normal.     Ortho Exam Right knee shows a large joint effusion that is painful to range of motion.  No signs of infection.  No warmth or cellulitis. Left hip surgical dressing is clean dry and intact.   Specialty Comments:  No specialty comments available.  Imaging: XR HIP UNILAT W OR W/O PELVIS 2-3 VIEWS LEFT  Result Date: 07/06/2019 Stable total hip replacement without complication  XR KNEE 3 VIEW RIGHT  Result Date: 07/06/2019 No acute or structural abnormalities. Mild OA    PMFS History: Patient Active Problem List   Diagnosis Date Noted  . DJD (degenerative joint disease) 06/28/2019  . Status post total replacement of left hip 06/28/2019  . Unilateral primary osteoarthritis, left hip 06/01/2019  . Acute idiopathic gout of left knee 03/10/2019  . Depression (emotion) 01/01/2015   Past Medical History:  Diagnosis Date  . Anxiety   . Arthritis    Gout  . Depression   . Gout   . History of kidney stones   .  Pneumonia    as a child    History reviewed. No pertinent family history.  Past Surgical History:  Procedure Laterality Date  . EYE SURGERY     BB 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  . TOTAL HIP ARTHROPLASTY Left 06/28/2019   Procedure: LEFT TOTAL HIP ARTHROPLASTY ANTERIOR APPROACH;  Surgeon: Tarry Kos, MD;  Location: [facility] OR;  Service: Orthopedics;  Laterality: Left;  . WISDOM TOOTH EXTRACTION     Social History   Occupational History  . Not on file  Tobacco Use  . Smoking status: Never Smoker  . Smokeless tobacco: Never Used  Vaping Use  . Vaping Use: Never used  Substance and Sexual Activity  . Alcohol use: No    Alcohol/week: 0.0 standard drinks  . Drug use: No  . Sexual activity: Yes

## 2019-07-07 LAB — SYNOVIAL CELL COUNT + DIFF, W/ CRYSTALS
Basophils, %: 0 %
Eosinophils-Synovial: 0 % (ref 0–2)
Lymphocytes-Synovial Fld: 5 % (ref 0–74)
Monocyte/Macrophage: 8 % (ref 0–69)
Neutrophil, Synovial: 87 % — ABNORMAL HIGH (ref 0–24)
Synoviocytes, %: 0 % (ref 0–15)
WBC, Synovial: 59210 cells/uL — ABNORMAL HIGH (ref <150)

## 2019-07-07 LAB — TIQ-NTM

## 2019-07-07 NOTE — Progress Notes (Signed)
Fluid is consistent with gout

## 2019-07-09 ENCOUNTER — Telehealth: Payer: Self-pay | Admitting: Orthopaedic Surgery

## 2019-07-09 NOTE — Telephone Encounter (Signed)
Pt called stating he had Discussed a gout medication with Dr. Roda Shutters that he believes starts with a C but cant remember; he would like to see if Dr.Xu remembers and if so would he call it in @ [facility] in [place]?  [phone]

## 2019-07-09 NOTE — Telephone Encounter (Signed)
Please advise 

## 2019-07-09 NOTE — Telephone Encounter (Signed)
IC s/w patient and advised  

## 2019-07-09 NOTE — Telephone Encounter (Signed)
I sent in colchicine yesterday to [facility]

## 2019-07-13 ENCOUNTER — Ambulatory Visit (INDEPENDENT_AMBULATORY_CARE_PROVIDER_SITE_OTHER): Payer: 59 | Admitting: Orthopaedic Surgery

## 2019-07-13 DIAGNOSIS — M10062 Idiopathic gout, left knee: Secondary | ICD-10-CM

## 2019-07-13 DIAGNOSIS — Z96642 Presence of left artificial hip joint: Secondary | ICD-10-CM | POA: Diagnosis not present

## 2019-07-13 MED ORDER — FEBUXOSTAT 40 MG PO TABS: 40.0000 mg | ORAL_TABLET | Freq: Every day | ORAL | 3 refills | Status: DC

## 2019-07-13 MED ORDER — PREDNISONE 10 MG (21) PO TBPK: ORAL_TABLET | ORAL | 0 refills | Status: DC

## 2019-07-13 NOTE — Progress Notes (Signed)
Post-Op Visit Note   Patient: Derrick Barr           Date of Birth: 30-May-1970           MRN: [id number] Visit Date: 07/13/2019 PCP: Patient, [name]   Assessment & Plan:  Chief Complaint:  Chief Complaint  Patient presents with  . Left Hip - Routine Post Op    Left THA--DOS: 06/28/19   Visit Diagnoses:  1. Acute idiopathic gout of left knee   2. Status post total replacement of left hip     Plan: Derrick Barr is 2 weeks status post left total knee replacement.  He is doing very well in that regard.  He completed home health PT.  For the right knee this is actually more bothersome.  This was aspirated and injected last week and the fluid was consistent with a gout flareup.  He has been taking the colchicine as well.  His left hip incision is fully healed.  No signs of infection.  Right knee shows no recurrent effusion.  No signs of infection.  Painful range of motion.  No evidence of infection.  I gave him a prescription for Uloric as well as a prednisone Dosepak.  He should follow-up with his PCP about management.  He is also going to contact his PCP about a possible UTI.  From my standpoint we will see her back in 4 weeks with standing AP pelvis x-ray.  Continue aspirin for DVT prophylaxis.  Follow-Up Instructions: Return in about 4 weeks (around 08/10/2019).   Orders:  No orders of the defined types were placed in this encounter.  Meds ordered this encounter  Medications  . predniSONE (STERAPRED UNI-PAK 21 TAB) 10 MG (21) TBPK tablet    Sig: Take as directed    Dispense:  21 tablet    Refill:  0  . febuxostat (ULORIC) 40 MG tablet    Sig: Take 1 tablet (40 mg total) by mouth daily.    Dispense:  30 tablet    Refill:  3  . predniSONE (STERAPRED UNI-PAK 21 TAB) 10 MG (21) TBPK tablet    Sig: Take as directed    Dispense:  21 tablet    Refill:  0    Imaging: No results found.  PMFS History: Patient Active Problem List   Diagnosis Date Noted  . DJD (degenerative joint  disease) 06/28/2019  . Status post total replacement of left hip 06/28/2019  . Unilateral primary osteoarthritis, left hip 06/01/2019  . Acute idiopathic gout of left knee 03/10/2019  . Depression (emotion) 01/01/2015   Past Medical History:  Diagnosis Date  . Anxiety   . Arthritis    Gout  . Depression   . Gout   . History of kidney stones   . Pneumonia    as a child    No family history on file.  Past Surgical History:  Procedure Laterality Date  . EYE SURGERY     BB 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  . TOTAL HIP ARTHROPLASTY Left 06/28/2019   Procedure: LEFT TOTAL HIP ARTHROPLASTY ANTERIOR APPROACH;  Surgeon: Leandrew Koyanagi, MD;  Location: [facility];  Service: Orthopedics;  Laterality: Left;  . WISDOM TOOTH EXTRACTION     Social History   Occupational History  . Not on file  Tobacco Use  . Smoking status: Never Smoker  . Smokeless tobacco: Never Used  Vaping Use  . Vaping Use: Never used  Substance and Sexual Activity  . Alcohol  use: No    Alcohol/week: 0.0 standard drinks  . Drug use: No  . Sexual activity: Yes

## 2019-07-31 ENCOUNTER — Encounter (HOSPITAL_COMMUNITY)
Admission: EM | Disposition: A | Discharge status: Home / Self Care | Payer: Self-pay | Source: Home / Self Care | Attending: Urology

## 2019-07-31 ENCOUNTER — Other Ambulatory Visit: Payer: Self-pay

## 2019-07-31 ENCOUNTER — Encounter (HOSPITAL_COMMUNITY): Payer: Self-pay | Admitting: Certified Registered Nurse Anesthetist

## 2019-07-31 ENCOUNTER — Inpatient Hospital Stay (HOSPITAL_COMMUNITY): Payer: 59

## 2019-07-31 ENCOUNTER — Emergency Department (HOSPITAL_COMMUNITY): Payer: 59 | Admitting: Anesthesiology

## 2019-07-31 ENCOUNTER — Inpatient Hospital Stay (HOSPITAL_COMMUNITY)
Admission: EM | Admit: 2019-07-31 | Discharge: 2019-08-02 | DRG: 660 | Disposition: A | Discharge status: Home / Self Care | Payer: 59 | Attending: Urology | Admitting: Urology

## 2019-07-31 ENCOUNTER — Emergency Department (HOSPITAL_COMMUNITY): Payer: 59

## 2019-07-31 ENCOUNTER — Encounter (HOSPITAL_COMMUNITY): Payer: Self-pay

## 2019-07-31 DIAGNOSIS — E669 Obesity, unspecified: Secondary | ICD-10-CM | POA: Diagnosis present

## 2019-07-31 DIAGNOSIS — M109 Gout, unspecified: Secondary | ICD-10-CM | POA: Diagnosis not present

## 2019-07-31 DIAGNOSIS — Z87442 Personal history of urinary calculi: Secondary | ICD-10-CM | POA: Diagnosis not present

## 2019-07-31 DIAGNOSIS — N12 Tubulo-interstitial nephritis, not specified as acute or chronic: Secondary | ICD-10-CM | POA: Diagnosis present

## 2019-07-31 DIAGNOSIS — N132 Hydronephrosis with renal and ureteral calculous obstruction: Secondary | ICD-10-CM | POA: Diagnosis present

## 2019-07-31 DIAGNOSIS — Z7982 Long term (current) use of aspirin: Secondary | ICD-10-CM | POA: Diagnosis not present

## 2019-07-31 DIAGNOSIS — Z20822 Contact with and (suspected) exposure to covid-19: Secondary | ICD-10-CM | POA: Diagnosis not present

## 2019-07-31 DIAGNOSIS — Z6831 Body mass index (BMI) 31.0-31.9, adult: Secondary | ICD-10-CM

## 2019-07-31 DIAGNOSIS — N390 Urinary tract infection, site not specified: Secondary | ICD-10-CM

## 2019-07-31 DIAGNOSIS — R6510 Systemic inflammatory response syndrome (SIRS) of non-infectious origin without acute organ dysfunction: Secondary | ICD-10-CM

## 2019-07-31 DIAGNOSIS — Z7952 Long term (current) use of systemic steroids: Secondary | ICD-10-CM

## 2019-07-31 DIAGNOSIS — Z96642 Presence of left artificial hip joint: Secondary | ICD-10-CM | POA: Diagnosis not present

## 2019-07-31 DIAGNOSIS — N200 Calculus of kidney: Secondary | ICD-10-CM

## 2019-07-31 DIAGNOSIS — F419 Anxiety disorder, unspecified: Secondary | ICD-10-CM | POA: Diagnosis present

## 2019-07-31 DIAGNOSIS — F329 Major depressive disorder, single episode, unspecified: Secondary | ICD-10-CM | POA: Diagnosis not present

## 2019-07-31 DIAGNOSIS — N202 Calculus of kidney with calculus of ureter: Principal | ICD-10-CM | POA: Diagnosis present

## 2019-07-31 DIAGNOSIS — Z79899 Other long term (current) drug therapy: Secondary | ICD-10-CM | POA: Diagnosis not present

## 2019-07-31 DIAGNOSIS — R319 Hematuria, unspecified: Secondary | ICD-10-CM

## 2019-07-31 HISTORY — PX: CYSTOSCOPY WITH RETROGRADE PYELOGRAM, URETEROSCOPY AND STENT PLACEMENT: SHX5789

## 2019-07-31 LAB — URINALYSIS, ROUTINE W REFLEX MICROSCOPIC
Bilirubin Urine: NEGATIVE
Glucose, UA: NEGATIVE mg/dL
Hgb urine dipstick: NEGATIVE
Ketones, ur: NEGATIVE mg/dL
Nitrite: NEGATIVE
Protein, ur: 30 mg/dL — AB
Specific Gravity, Urine: 1.018 (ref 1.005–1.030)
WBC, UA: >50 WBC/hpf — ABNORMAL HIGH (ref 0–5)
pH: 5 (ref 5.0–8.0)

## 2019-07-31 LAB — CBC WITH DIFFERENTIAL/PLATELET
Abs Immature Granulocytes: 0.05 10*3/uL (ref 0.00–0.07)
Basophils Absolute: 0.1 10*3/uL (ref 0.0–0.1)
Basophils Relative: 0 %
Eosinophils Absolute: 0.1 10*3/uL (ref 0.0–0.5)
Eosinophils Relative: 1 %
HCT: 41 % (ref 39.0–52.0)
Hemoglobin: 13.1 g/dL (ref 13.0–17.0)
Immature Granulocytes: 0 %
Lymphocytes Relative: 14 %
Lymphs Abs: 2.2 10*3/uL (ref 0.7–4.0)
MCH: 29.7 pg (ref 26.0–34.0)
MCHC: 32 g/dL (ref 30.0–36.0)
MCV: 93 fL (ref 80.0–100.0)
Monocytes Absolute: 1.5 10*3/uL — ABNORMAL HIGH (ref 0.1–1.0)
Monocytes Relative: 9 %
Neutro Abs: 12 10*3/uL — ABNORMAL HIGH (ref 1.7–7.7)
Neutrophils Relative %: 76 %
Platelets: 345 10*3/uL (ref 150–400)
RBC: 4.41 MIL/uL (ref 4.22–5.81)
RDW: 13.3 % (ref 11.5–15.5)
WBC: 15.9 10*3/uL — ABNORMAL HIGH (ref 4.0–10.5)
nRBC: 0 % (ref 0.0–0.2)

## 2019-07-31 LAB — COMPREHENSIVE METABOLIC PANEL
ALT: 45 U/L — ABNORMAL HIGH (ref 0–44)
AST: 47 U/L — ABNORMAL HIGH (ref 15–41)
Albumin: 3.9 g/dL (ref 3.5–5.0)
Alkaline Phosphatase: 113 U/L (ref 38–126)
Anion gap: 12 (ref 5–15)
BUN: 10 mg/dL (ref 6–20)
CO2: 26 mmol/L (ref 22–32)
Calcium: 9.2 mg/dL (ref 8.9–10.3)
Chloride: 96 mmol/L — ABNORMAL LOW (ref 98–111)
Creatinine, Ser: 0.91 mg/dL (ref 0.61–1.24)
GFR calc Af Amer: >60 mL/min (ref >60)
GFR calc non Af Amer: >60 mL/min (ref >60)
Glucose, Bld: 116 mg/dL — ABNORMAL HIGH (ref 70–99)
Potassium: 3.8 mmol/L (ref 3.5–5.1)
Sodium: 134 mmol/L — ABNORMAL LOW (ref 135–145)
Total Bilirubin: 0.6 mg/dL (ref 0.3–1.2)
Total Protein: 8.4 g/dL — ABNORMAL HIGH (ref 6.5–8.1)

## 2019-07-31 LAB — SARS CORONAVIRUS 2 BY RT PCR (HOSPITAL ORDER, PERFORMED IN ~~LOC~~ HOSPITAL LAB): SARS Coronavirus 2: NEGATIVE

## 2019-07-31 LAB — MRSA PCR SCREENING: MRSA by PCR: NEGATIVE

## 2019-07-31 LAB — LACTIC ACID, PLASMA: Lactic Acid, Venous: 1.4 mmol/L (ref 0.5–1.9)

## 2019-07-31 SURGERY — CYSTOURETEROSCOPY, WITH RETROGRADE PYELOGRAM AND STENT INSERTION
Anesthesia: General | Site: Ureter | Laterality: Bilateral

## 2019-07-31 SURGERY — CYSTOURETEROSCOPY, WITH RETROGRADE PYELOGRAM AND STENT INSERTION
Anesthesia: General | Laterality: Bilateral

## 2019-07-31 MED ORDER — OXYCODONE HCL 5 MG/5ML PO SOLN: 5.0000 mg | Freq: Once | ORAL | Status: DC | PRN

## 2019-07-31 MED ORDER — HYDROMORPHONE HCL 1 MG/ML IJ SOLN
0.5000 mg | INTRAMUSCULAR | Status: DC | PRN
  Administered 2019-08-01: 1 mg via INTRAVENOUS
  Administered 2019-08-02: 0.5 mg via INTRAVENOUS
  Filled 2019-07-31 (×2): qty 1

## 2019-07-31 MED ORDER — ONDANSETRON HCL 4 MG/2ML IJ SOLN
4.0000 mg | Freq: Once | INTRAMUSCULAR | Status: AC
  Administered 2019-07-31: 4 mg via INTRAVENOUS
  Filled 2019-07-31: qty 2

## 2019-07-31 MED ORDER — OXYCODONE HCL 5 MG PO TABS: 5.0000 mg | ORAL_TABLET | ORAL | Status: DC | PRN

## 2019-07-31 MED ORDER — SODIUM CHLORIDE 0.9 % IR SOLN
Status: DC | PRN
  Administered 2019-07-31: 1000 mL

## 2019-07-31 MED ORDER — IOHEXOL 300 MG/ML  SOLN
100.0000 mL | Freq: Once | INTRAMUSCULAR | Status: AC | PRN
  Administered 2019-07-31: 100 mL via INTRAVENOUS

## 2019-07-31 MED ORDER — SODIUM CHLORIDE 0.9 % IV SOLN: INTRAVENOUS | Status: DC

## 2019-07-31 MED ORDER — PROPOFOL 10 MG/ML IV BOLUS
INTRAVENOUS | Status: AC
  Filled 2019-07-31: qty 20

## 2019-07-31 MED ORDER — MIDAZOLAM HCL 2 MG/2ML IJ SOLN
INTRAMUSCULAR | Status: AC
  Filled 2019-07-31: qty 2

## 2019-07-31 MED ORDER — FENTANYL CITRATE (PF) 100 MCG/2ML IJ SOLN: 25.0000 ug | INTRAMUSCULAR | Status: DC | PRN

## 2019-07-31 MED ORDER — OXYCODONE HCL 5 MG PO TABS: 5.0000 mg | ORAL_TABLET | Freq: Once | ORAL | Status: DC | PRN

## 2019-07-31 MED ORDER — SODIUM CHLORIDE 0.9 % IV BOLUS
1000.0000 mL | Freq: Once | INTRAVENOUS | Status: AC
  Administered 2019-07-31: 1000 mL via INTRAVENOUS

## 2019-07-31 MED ORDER — ACETAMINOPHEN 500 MG PO TABS
1000.0000 mg | ORAL_TABLET | Freq: Three times a day (TID) | ORAL | Status: DC
  Administered 2019-07-31 – 2019-08-02 (×5): 1000 mg via ORAL
  Filled 2019-07-31 (×5): qty 2

## 2019-07-31 MED ORDER — HYDROMORPHONE HCL 1 MG/ML IJ SOLN
1.0000 mg | Freq: Once | INTRAMUSCULAR | Status: AC
  Administered 2019-07-31: 1 mg via INTRAVENOUS
  Filled 2019-07-31: qty 1

## 2019-07-31 MED ORDER — SENNOSIDES-DOCUSATE SODIUM 8.6-50 MG PO TABS
1.0000 | ORAL_TABLET | Freq: Two times a day (BID) | ORAL | Status: DC
  Administered 2019-07-31 – 2019-08-01 (×3): 1 via ORAL
  Filled 2019-07-31 (×3): qty 1

## 2019-07-31 MED ORDER — PROPOFOL 10 MG/ML IV BOLUS
INTRAVENOUS | Status: DC | PRN
  Administered 2019-07-31: 200 mg via INTRAVENOUS

## 2019-07-31 MED ORDER — SODIUM CHLORIDE 0.9 % IV SOLN
1.0000 g | INTRAVENOUS | Status: DC
  Administered 2019-07-31 – 2019-08-01 (×2): 1 g via INTRAVENOUS
  Filled 2019-07-31: qty 1

## 2019-07-31 MED ORDER — LIDOCAINE 2% (20 MG/ML) 5 ML SYRINGE
INTRAMUSCULAR | Status: DC | PRN
  Administered 2019-07-31: 60 mg via INTRAVENOUS

## 2019-07-31 MED ORDER — CHLORHEXIDINE GLUCONATE 0.12 % MT SOLN
15.0000 mL | Freq: Two times a day (BID) | OROMUCOSAL | Status: DC
  Administered 2019-07-31: 15 mL via OROMUCOSAL

## 2019-07-31 MED ORDER — MORPHINE SULFATE (PF) 4 MG/ML IV SOLN
4.0000 mg | Freq: Once | INTRAVENOUS | Status: AC
  Administered 2019-07-31: 4 mg via INTRAVENOUS
  Filled 2019-07-31: qty 1

## 2019-07-31 MED ORDER — FENTANYL CITRATE (PF) 100 MCG/2ML IJ SOLN
INTRAMUSCULAR | Status: DC | PRN
  Administered 2019-07-31 (×2): 50 ug via INTRAVENOUS

## 2019-07-31 MED ORDER — ONDANSETRON HCL 4 MG/2ML IJ SOLN: 4.0000 mg | Freq: Four times a day (QID) | INTRAMUSCULAR | Status: DC | PRN

## 2019-07-31 MED ORDER — IOHEXOL 300 MG/ML  SOLN
INTRAMUSCULAR | Status: DC | PRN
  Administered 2019-07-31: 20 mL via URETHRAL

## 2019-07-31 MED ORDER — SODIUM CHLORIDE 0.9 % IV SOLN
INTRAVENOUS | Status: AC
  Filled 2019-07-31: qty 10

## 2019-07-31 MED ORDER — FENTANYL CITRATE (PF) 100 MCG/2ML IJ SOLN
INTRAMUSCULAR | Status: AC
  Filled 2019-07-31: qty 2

## 2019-07-31 MED ORDER — MIDAZOLAM HCL 5 MG/5ML IJ SOLN
INTRAMUSCULAR | Status: DC | PRN
  Administered 2019-07-31: 2 mg via INTRAVENOUS

## 2019-07-31 MED ORDER — ORAL CARE MOUTH RINSE: 15.0000 mL | Freq: Two times a day (BID) | OROMUCOSAL | Status: DC

## 2019-07-31 MED ORDER — LACTATED RINGERS IV SOLN: INTRAVENOUS | Status: DC | PRN

## 2019-07-31 MED ORDER — CHLORHEXIDINE GLUCONATE CLOTH 2 % EX PADS
6.0000 | MEDICATED_PAD | Freq: Every day | CUTANEOUS | Status: DC
  Administered 2019-07-31: 6 via TOPICAL

## 2019-07-31 MED ORDER — SODIUM CHLORIDE 0.9 % IV SOLN
1.0000 g | Freq: Once | INTRAVENOUS | Status: AC
  Administered 2019-07-31: 1 g via INTRAVENOUS
  Filled 2019-07-31: qty 10

## 2019-07-31 SURGICAL SUPPLY — 20 items
BAG URO CATCHER STRL LF (MISCELLANEOUS) ×2 IMPLANT
BASKET LASER NITINOL 1.9FR (BASKET) IMPLANT
CATH INTERMIT  6FR 70CM (CATHETERS) ×4 IMPLANT
CLOTH BEACON ORANGE TIMEOUT ST (SAFETY) ×2 IMPLANT
EXTRACTOR STONE 1.7FRX115CM (UROLOGICAL SUPPLIES) IMPLANT
FIBER LASER FLEXIVA 365 (UROLOGICAL SUPPLIES) IMPLANT
FIBER LASER TRAC TIP (UROLOGICAL SUPPLIES) IMPLANT
GLOVE BIOGEL M STRL SZ7.5 (GLOVE) ×2 IMPLANT
GOWN STRL REUS W/TWL LRG LVL3 (GOWN DISPOSABLE) ×2 IMPLANT
GUIDEWIRE ANG ZIPWIRE 038X150 (WIRE) ×2 IMPLANT
GUIDEWIRE STR DUAL SENSOR (WIRE) ×2 IMPLANT
KIT TURNOVER KIT A (KITS) IMPLANT
MANIFOLD NEPTUNE II (INSTRUMENTS) ×2 IMPLANT
PACK CYSTO (CUSTOM PROCEDURE TRAY) ×2 IMPLANT
SHEATH URETERAL 12FRX28CM (UROLOGICAL SUPPLIES) IMPLANT
SHEATH URETERAL 12FRX35CM (MISCELLANEOUS) IMPLANT
STENT POLARIS 5FRX26 (STENTS) ×4 IMPLANT
TUBE FEEDING 8FR 16IN STR KANG (MISCELLANEOUS) ×2 IMPLANT
TUBING CONNECTING 10 (TUBING) ×2 IMPLANT
TUBING UROLOGY SET (TUBING) ×2 IMPLANT

## 2019-07-31 NOTE — Brief Op Note (Signed)
07/31/2019  8:42 PM  PATIENT:  Derrick Barr  49 y.o. male  PRE-OPERATIVE DIAGNOSIS:  * No pre-op diagnosis entered *  POST-OPERATIVE DIAGNOSIS:  * No post-op diagnosis entered *  PROCEDURE:  Procedure(s): CYSTOSCOPY WITH RETROGRADE PYELOGRAM, URETEROSCOPY AND STENT PLACEMENT (Bilateral)  SURGEON:  Surgeon(s) and Role:    * Sebastian Ache, MD - Primary  PHYSICIAN ASSISTANT:   ASSISTANTS: none   ANESTHESIA:   general  EBL:  minimal   BLOOD ADMINISTERED:none  DRAINS: none   LOCAL MEDICATIONS USED:  NONE  SPECIMEN:  No Specimen  DISPOSITION OF SPECIMEN:  N/A  COUNTS:  YES  TOURNIQUET:  * No tourniquets in log *  DICTATION: .Other Dictation: Dictation Number [id number]  PLAN OF CARE: Admit to inpatient   PATIENT DISPOSITION:  PACU - hemodynamically stable.   Delay start of Pharmacological VTE agent (>24hrs) due to surgical blood loss or risk of bleeding: yes

## 2019-07-31 NOTE — ED Notes (Addendum)
Report to [facility] [employer],

## 2019-07-31 NOTE — ED Notes (Signed)
Pt to go directly to [facility]  Call to [place], [phone]  Report to Darl Pikes, RN, CN

## 2019-07-31 NOTE — Anesthesia Postprocedure Evaluation (Signed)
Anesthesia Post Note  Patient: Derrick Barr  Procedure(s) Performed: CYSTOSCOPY WITH RETROGRADE PYELOGRAM, URETEROSCOPY AND STENT PLACEMENT (Bilateral Ureter)     Patient location during evaluation: PACU Anesthesia Type: General Level of consciousness: awake and alert Pain management: pain level controlled Vital Signs Assessment: post-procedure vital signs reviewed and stable Respiratory status: spontaneous breathing, nonlabored ventilation, respiratory function stable and patient connected to nasal cannula oxygen Cardiovascular status: blood pressure returned to baseline and stable Postop Assessment: no apparent nausea or vomiting Anesthetic complications: no   No complications documented.  Last Vitals:  Vitals:   07/31/19 2100 07/31/19 2115  BP: (!) 134/93 133/83  Pulse: 92 93  Resp: 16 (!) 22  Temp:    SpO2: 100% 92%    Last Pain:  Vitals:   07/31/19 2055  TempSrc:   PainSc: 0-No pain                 Nimisha Rathel S

## 2019-07-31 NOTE — Transfer of Care (Signed)
Immediate Anesthesia Transfer of Care Note  Patient: Derrick Barr  Procedure(s) Performed: CYSTOSCOPY WITH RETROGRADE PYELOGRAM, URETEROSCOPY AND STENT PLACEMENT (Bilateral Ureter)  Patient Location: PACU  Anesthesia Type:General  Level of Consciousness: unresponsive, patient cooperative and responds to stimulation  Airway & Oxygen Therapy: Patient Spontanous Breathing and Patient connected to face mask oxygen  Post-op Assessment: Report given to RN and Post -op Vital signs reviewed and stable  Post vital signs: Reviewed and stable  Last Vitals:  Vitals Value Taken Time  BP    Temp    Pulse 92 07/31/19 2055  Resp 22 07/31/19 2055  SpO2 98 % 07/31/19 2055  Vitals shown include unvalidated device data.  Last Pain:  Vitals:   07/31/19 1901  TempSrc: Oral  PainSc: 8       Patients Stated Pain Goal: 0 (07/31/19 1901)  Complications: No complications documented.

## 2019-07-31 NOTE — ED Provider Notes (Signed)
°  Provider Note MRN:  [id number]  Arrival date & time: 07/31/19    ED Course and Medical Decision Making  Assumed care from Dr. Charm Barges at shift change.  Repeatedly treated for UTI, CT imaging is revealing an obstructing 9 mm kidney stone.  This would be concerning for an infected kidney stone, will consult urology for recommendations.  Patient has received fluids and antibiotics, hemodynamically stable.  We will send to [facility] the ED to ED transfer to be assessed by Dr. Kathrynn Running for procedural management.  Accepting physician Dr. Jeraldine Loots.  .Critical Care Performed by: Sabas Sous, MD Authorized by: Sabas Sous, MD   Critical care provider statement:    Critical care time (minutes):  32   Critical care was necessary to treat or prevent imminent or life-threatening deterioration of the following conditions: Infected kidney stone requiring urgent procedural management.   Critical care was time spent personally by me on the following activities:  Discussions with consultants, evaluation of patient's response to treatment, examination of patient, ordering and performing treatments and interventions, ordering and review of laboratory studies, ordering and review of radiographic studies, pulse oximetry, re-evaluation of patient's condition, obtaining history from patient or surrogate and review of old charts   I assumed direction of critical care for this patient from another provider in my specialty: yes      Final Clinical Impressions(s) / ED Diagnoses     ICD-10-CM   1. Kidney stone  N20.0   2. Urinary tract infection with hematuria, site unspecified  N39.0    R31.9     ED Discharge Orders    None      Discharge Instructions   None     Elmer Sow. Pilar Plate, MD [facility] [email or web address]    Sabas Sous, MD 07/31/19 [phone]

## 2019-07-31 NOTE — Anesthesia Preprocedure Evaluation (Signed)
Anesthesia Evaluation  Patient identified by MRN, date of birth, ID band Patient awake    Reviewed: Allergy & Precautions, H&P , NPO status , Patient's Chart, lab work & pertinent test results  Airway Mallampati: II   Neck ROM: full    Dental   Pulmonary neg pulmonary ROS,    breath sounds clear to auscultation       Cardiovascular negative cardio ROS   Rhythm:regular Rate:Normal     Neuro/Psych PSYCHIATRIC DISORDERS Anxiety Depression    GI/Hepatic   Endo/Other    Renal/GU Renal diseaseStones. Currently unable to urinate     Musculoskeletal  (+) Arthritis , S/p total hip replacement 06/28/19   Abdominal   Peds  Hematology   Anesthesia Other Findings   Reproductive/Obstetrics                             Anesthesia Physical Anesthesia Plan  ASA: II and emergent  Anesthesia Plan: General   Post-op Pain Management:    Induction: Intravenous  PONV Risk Score and Plan: 2 and Ondansetron, Dexamethasone, Midazolam and Treatment may vary due to age or medical condition  Airway Management Planned: LMA  Additional Equipment:   Intra-op Plan:   Post-operative Plan: Extubation in OR  Informed Consent: I have reviewed the patients History and Physical, chart, labs and discussed the procedure including the risks, benefits and alternatives for the proposed anesthesia with the patient or authorized representative who has indicated his/her understanding and acceptance.       Plan Discussed with: CRNA, Anesthesiologist and Surgeon  Anesthesia Plan Comments:         Anesthesia Quick Evaluation
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
## 2019-07-31 NOTE — Anesthesia Procedure Notes (Signed)
Procedure Name: LMA Insertion Performed by: Danilyn Cocke J, CRNA Pre-anesthesia Checklist: Patient identified, Emergency Drugs available, Suction available, Patient being monitored and Timeout performed Patient Re-evaluated:Patient Re-evaluated prior to induction Oxygen Delivery Method: Circle system utilized Preoxygenation: Pre-oxygenation with 100% oxygen Induction Type: IV induction Ventilation: Mask ventilation without difficulty LMA: LMA inserted LMA Size: 4.0 Number of attempts: 1 Placement Confirmation: positive ETCO2 and breath sounds checked- equal and bilateral Tube secured with: Tape Dental Injury: Teeth and Oropharynx as per pre-operative assessment        
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
## 2019-07-31 NOTE — ED Notes (Signed)
Consent transcribed, patient CHG wiped and transported to OR.

## 2019-07-31 NOTE — ED Provider Notes (Signed)
49 year old male arrives as transfer from [facility] emergency department for concern of infected kidney stone.  Please see previous provider notes for full details.  2 days ago patient experienced fevers, headaches, nausea, dark urine and right flank pain.  Started on the Bactrim.  Symptoms worsened and he presented to the ER.  Work-up significant for leukocytosis of 15.9.  Covid test negative.  Creatinine within normal limits.  Urinalysis suggestive of infection.  CT scan shows 9 mm obstructing right-sided kidney stone.  Consult was placed to Dr. Berneice Heinrich who advised ED to ED transfer to [facility]. Physical Exam  BP 121/84   Pulse 89   Temp 98.8 F (37.1 C) (Oral)   Resp 16   Ht 5\' 11"  (1.803 m)   Wt 101.6 kg   SpO2 95%   BMI 31.24 kg/m   Physical Exam Constitutional:      General: He is not in acute distress.    Appearance: Normal appearance. He is well-developed. He is not toxic-appearing or diaphoretic.     Comments: Uncomfortable appearing  HENT:     Head: Normocephalic and atraumatic.  Eyes:     General: Vision grossly intact. Gaze aligned appropriately.     Pupils: Pupils are equal, round, and reactive to light.  Neck:     Trachea: Trachea and phonation normal.  Pulmonary:     Effort: Pulmonary effort is normal. No respiratory distress.  Abdominal:     General: There is no distension.     Palpations: Abdomen is soft.     Tenderness: There is no abdominal tenderness. There is right CVA tenderness. There is no guarding or rebound.  Musculoskeletal:        General: Normal range of motion.     Cervical back: Normal range of motion.  Skin:    General: Skin is warm and dry.  Neurological:     Mental Status: He is alert.     GCS: GCS eye subscore is 4. GCS verbal subscore is 5. GCS motor subscore is 6.     Comments: Speech is clear and goal oriented, follows commands Major Cranial nerves without deficit, no facial droop Moves extremities without ataxia, coordination intact   Psychiatric:        Behavior: Behavior normal.     ED Course/Procedures     Procedures  MDM  7 PM: Patient arrives to [facility], ER no acute distress reports some increased right flank pain, previous morphine has worn off.  1 mg Dilaudid ordered.  Consult placed to urology. - 7:09 PM: Discussed case with urologist Dr. [facility], on the way to see patient.   Note: Portions of this report may have been transcribed using voice recognition software. Every effort was made to ensure accuracy; however, inadvertent computerized transcription errors may still be present.    Berneice Heinrich 07/31/19 1945    10/01/19, MD 08/02/19 [phone]

## 2019-07-31 NOTE — ED Triage Notes (Signed)
Pt reports being diagnosed with kidney infection from UTI secondary to foley during hip surgery x4 weeks ago.   Pt c/o left flank pain, headache, fever and chills.   Reports being diagnosed yesterday by Dr. Margo Aye and advised to come to ER if sx worsen. Pt states sx have worsened today.

## 2019-07-31 NOTE — ED Notes (Signed)
Seen by Dr Marcelo Baldy office yesterday  "given a shot and some pills" for a UTI   Sent home with Bactrim  Here because he reports he feels worse Has a headache and his back hurts

## 2019-08-01 ENCOUNTER — Encounter (HOSPITAL_COMMUNITY): Payer: Self-pay | Admitting: Urology

## 2019-08-01 LAB — CBC
HCT: 37.3 % — ABNORMAL LOW (ref 39.0–52.0)
Hemoglobin: 12.2 g/dL — ABNORMAL LOW (ref 13.0–17.0)
MCH: 29.8 pg (ref 26.0–34.0)
MCHC: 32.7 g/dL (ref 30.0–36.0)
MCV: 91 fL (ref 80.0–100.0)
Platelets: 334 10*3/uL (ref 150–400)
RBC: 4.1 MIL/uL — ABNORMAL LOW (ref 4.22–5.81)
RDW: 13 % (ref 11.5–15.5)
WBC: 14 10*3/uL — ABNORMAL HIGH (ref 4.0–10.5)
nRBC: 0 % (ref 0.0–0.2)

## 2019-08-01 MED ORDER — CHLORHEXIDINE GLUCONATE CLOTH 2 % EX PADS: 6.0000 | MEDICATED_PAD | Freq: Every day | CUTANEOUS | Status: DC

## 2019-08-01 NOTE — Progress Notes (Signed)
Received patient from ICU, report from [facility], oriented to unit, call light placed in reach

## 2019-08-01 NOTE — Op Note (Signed)
Derrick Barr, [facility] MEDICAL RECORD ZO:[id number] ACCOUNT [id number] DATE OF BIRTH:12/06/1970 FACILITY: [facility] LOCATION: [id number] PHYSICIAN:Lisle Skillman Berneice Heinrich, MD  OPERATIVE REPORT  DATE OF PROCEDURE:  07/31/2019  SURGEON:  Sebastian Ache, MD  PREOPERATIVE DIAGNOSES:   1.  Right ureteral, bilateral renal stones. 2.  Pyelonephritis.  PROCEDURES: 1.  Cystoscopy with bilateral retrograde pyelograms and interpretation. 2.  Insertion of bilateral ureteral stents, 5 x 26 Polaris, no tether.  ESTIMATED BLOOD LOSS:  Nil.  COMPLICATIONS:  None.  SPECIMENS:  None.  FINDINGS: 1.  Mild right hydronephrosis. 2.  Successful placement of bilateral ureteral stents, proximal end in renal pelvis, distal end in urinary bladder.  INDICATIONS:  The patient is a pleasant 49 year old man with longstanding history of gout, as well as recurrent urolithiasis.  He has on workup of colicky flank pain as well as progressive malaise and fevers to have a right proximal ureteral stone and  bilateral renal stones by axial imaging at [facility] ER earlier today.  He had urine cultures, blood cultures obtained.  COVID screen negative.  Urgent urologic evaluation was sought.  Given the patient's overall clinical presentation with  pyelonephritis and impending sepsis from obstructing stone, it was felt that urgent renal decompression was warranted.  Options were discussed.  The patient wished to proceed with stenting in a bilateral fashion as to put him on a path to eventually get  stone free.  Informed consent was obtained and placed in the medical record.  DESCRIPTION OF PROCEDURE:  The patient being identified, the procedure being cystoscopy and bilateral stent placement was confirmed.  Procedure timeout was performed.  Intravenous antibiotics administered.  General LMA anesthesia induced.  The patient  was placed into a low lithotomy position.  Sterile field was created, prepped and draped the base of the penis,  perineum and proximal thighs using iodine.  Cystourethroscopy was performed using 21-French rigid cystoscope with offset lens.  Inspection of  anterior and posterior urethra was unremarkable.  Inspection of bladder revealed no diverticula, calcifications, papillary lesions.  The urine was somewhat concentrated.  The ureteral orifices were singleton with relatively small caliber orifices.  The  right ureteral orifice was first cannulated with a sensor wire, then a 6-French open-ended catheter and right retrograde pyelogram was obtained.  Right retrograde pyelogram does a single right ureter, single system right kidney.  There was a questionable filling defect in the proximal ureter consistent with a stone.  There was mild hydronephrosis above this.  A sensor wire advanced to the level of  the lower pole over which a new 5 x 26 Contour-type stent was placed using cystoscopic and fluoroscopic guidance.  Good proximal and distal planes were noted.  Efflux of urine was seen around into the distal end of the stent.  It was not overtly  proteinaceous.  Similarly, the left ureteral orifice was cannulated with a sensor wire, over which an open-ended catheter was placed and left retrograde pyelogram was obtained.  Left retrograde pyelogram demonstrated a single left ureter with single system left kidney.  No obvious filling defects or narrowing noted.  The sensor wire was advanced to the level of the lower pole and a separate 5 x 26 Polaris-type stent was placed  using cystoscopic and fluoroscopic guidance.  Good proximal distal planes were noted.  As the patient was not tachycardic or hypotensive, not floridly septic at this time, decision was made not to place a catheter.  As such, the bladder was emptied per  cystoscope.  Procedure was  terminated.  The patient tolerated the procedure well.  No immediate perioperative complications.  The patient was taken to postanesthesia care unit in stable condition with plan  for inpatient admission to verify afebrile for  24 hours before discharge home.  VN/NUANCE  D:07/31/2019 T:08/01/2019 JOB:[id number]

## 2019-08-01 NOTE — Plan of Care (Signed)
  Problem: Education: Goal: Knowledge of General Education information will improve Description: Including pain rating scale, medication(s)/side effects and non-pharmacologic comfort measures Outcome: Progressing   Problem: Health Behavior/Discharge Planning: Goal: Ability to manage health-related needs will improve Outcome: Progressing   Problem: Clinical Measurements: Goal: Ability to maintain clinical measurements within normal limits will improve Outcome: Progressing Goal: Will remain free from infection Outcome: Progressing   Problem: Coping: Goal: Level of anxiety will decrease Outcome: Progressing   Problem: Elimination: Goal: Will not experience complications related to bowel motility Outcome: Progressing Goal: Will not experience complications related to urinary retention Outcome: Progressing   Problem: Safety: Goal: Ability to remain free from injury will improve Outcome: Progressing   Problem: Skin Integrity: Goal: Risk for impaired skin integrity will decrease Outcome: Progressing

## 2019-08-01 NOTE — Progress Notes (Signed)
1 Day Post-Op   Subjective/Chief Complaint:   1 - Recurrent Urolithiasis -  Pre 2021 - MET x 2 07/2019 - Rt 38m proximal stone and bilateral scattered renal stones (abour 416mx 3 each side) on ER Ct on eval flank pain and malaise.   2 - Pyelonephritis - fevers, rigors, chills x few days, EBC 15.9k Cr 0.9, lactate 1.4 c/w early obstructing pyelo. UCX,BCX 7/10 pending. UA with pyuria. Placed on empiric Rocephin.   PMH sig for gout, left hip replacement. NO ischemic CV disease / blood thinners.  Today "DwTuffis stable. No high fevers or hypotension overnight. UOP good. WBC stable / slightly down.   Objective: Vital signs in last 24 hours: Temp:  [97.6 F (36.4 C)-98.8 F (37.1 C)] 97.6 F (36.4 C) (07/11 0400) Pulse Rate:  [57-107] 57 (07/11 0700) Resp:  [12-24] 16 (07/11 0700) BP: (120-152)/(68-97) 121/70 (07/11 0700) SpO2:  [89 %-100 %] 94 % (07/11 0700) Weight:  [101.6 kg] 101.6 kg (07/10 1330)    Intake/Output from previous day: 07/10 0701 - 07/11 0700 In: -  Out: 550 [Urine:550] Intake/Output this shift: No intake/output data recorded.  Physical Exam Vitals reviewed.  HENT:     Head: Normocephalic.     Nose: Nose normal.     Mouth/Throat:     Mouth: Mucous membranes are moist.  Cardiovascular:     Rate and Rhythm: Normal rate.     Pulses: Normal pulses.  Pulmonary:     Effort: Pulmonary effort is normal.  Abdominal:     General: Abdomen is flat.     Comments: Mild obesity.   Genitourinary:    Comments: Improved Rt CVAT Musculoskeletal:        General: Normal range of motion.     Cervical back: Normal range of motion.  Skin:    General: Skin is warm.  Neurological:     General: No focal deficit present.     Mental Status: He is alert.  Psychiatric:        Mood and Affect: Mood normal.  Lab Results:  Recent Labs    07/31/19 1409 08/01/19 0255  WBC 15.9* 14.0*  HGB 13.1 12.2*  HCT 41.0 37.3*  PLT 345 334   BMET Recent Labs     07/31/19 1409  NA 134*  K 3.8  CL 96*  CO2 26  GLUCOSE 116*  BUN 10  CREATININE 0.91  CALCIUM 9.2   PT/INR No results for input(s): LABPROT, INR in the last 72 hours. ABG No results for input(s): PHART, HCO3 in the last 72 hours.  Invalid input(s): PCO2, PO2  Studies/Results: CT Abdomen Pelvis W Contrast  Result Date: 07/31/2019 CLINICAL DATA:  Pyelonephritis EXAM: CT ABDOMEN AND PELVIS WITH CONTRAST TECHNIQUE: Multidetector CT imaging of the abdomen and pelvis was performed using the standard protocol following bolus administration of intravenous contrast. CONTRAST:  10060mMNIPAQUE IOHEXOL 300 MG/ML  SOLN COMPARISON:  March 26, 2019 FINDINGS: Lower chest: The lung bases are clear. The heart size is normal. Hepatobiliary: The liver is normal. Normal gallbladder.There is no biliary ductal dilation. Pancreas: Normal contours without ductal dilatation. No peripancreatic fluid collection. Spleen: Unremarkable. Adrenals/Urinary Tract: --Adrenal glands: Unremarkable. --Right kidney/ureter: There is a large cyst arising from the upper pole the right kidney measuring approximately 9.4 by 9.4 cm. There is mild right-sided collecting system dilatation secondary to an obstructing 9 mm stone in the proximal right ureter (axial series 2, image 52). Additional nonobstructing stones are noted throughout the right  kidney. --Left kidney/ureter: Multiple nonobstructing stones are noted involving the left kidney without evidence for hydronephrosis. --Urinary bladder: Unremarkable. Stomach/Bowel: --Stomach/Duodenum: No hiatal hernia or other gastric abnormality. Normal duodenal course and caliber. --Small bowel: Unremarkable. --Colon: Rectosigmoid diverticulosis without acute inflammation. --Appendix: Normal. Vascular/Lymphatic: Normal course and caliber of the major abdominal vessels. --No retroperitoneal lymphadenopathy. --No mesenteric lymphadenopathy. --No pelvic or inguinal lymphadenopathy. Reproductive: The  prostate gland is enlarged. Other: No ascites or free air. There is a small fat containing umbilical hernia. Musculoskeletal. The patient is status post total hip arthroplasty on the left. There is an intramuscular fluid collection laterally measuring 5.5 x 3.3 cm. This is favored to represent a postoperative seroma or hematoma. The hardware appears grossly intact. IMPRESSION: 1. Mild right-sided collecting system dilatation secondary to an obstructing 9 mm stone in the proximal right ureter. 2. Bilateral nonobstructing nephrolithiasis. 3. Rectosigmoid diverticulosis without acute inflammation. 4. Status post total hip arthroplasty on the left. There is an intramuscular fluid collection laterally measuring 5.5 x 3.3 cm. This is favored to represent a postoperative seroma or hematoma. The hardware appears grossly intact. Electronically Signed   By: Constance Holster M.D.   On: 07/31/2019 15:57   DG C-Arm 1-60 Min-No Report  Result Date: 07/31/2019 Fluoroscopy was utilized by the requesting physician.  No radiographic interpretation.    Anti-infectives: Anti-infectives (From admission, onward)   Start     Dose/Rate Route Frequency Ordered Stop   07/31/19 2116  sodium chloride 0.9 % with cefTRIAXone (ROCEPHIN) ADS Med       Note to Pharmacy: Nicholes Rough   : cabinet override      07/31/19 2116 07/31/19 2203   07/31/19 2100  cefTRIAXone (ROCEPHIN) 1 g in sodium chloride 0.9 % 100 mL IVPB     Discontinue     1 g 200 mL/hr over 30 Minutes Intravenous Every 24 hours 07/31/19 2058     07/31/19 1500  cefTRIAXone (ROCEPHIN) 1 g in sodium chloride 0.9 % 100 mL IVPB        1 g 200 mL/hr over 30 Minutes Intravenous  Once 07/31/19 1450 07/31/19 1706      Assessment/Plan:  1 - Recurrent Urolithiasis - now temporized with stenting. Will plan for BILATERAL ureteroscopy in outpatietn setting in few weeks with goal of stone free. High suspicion of urate stones, composition will be important in determining optima  therapy.   2 - Pyelonephritis - appears to have been caught early and now improving with ABX / renal decompression.  Transfer to med-surg floor, remain in house, likely DC tomorrow if remains afebrile.   Derrick Barr 08/01/2019

## 2019-08-02 LAB — URINE CULTURE: Culture: NO GROWTH

## 2019-08-02 MED ORDER — AMOXICILLIN-POT CLAVULANATE 875-125 MG PO TABS
1.0000 | ORAL_TABLET | Freq: Two times a day (BID) | ORAL | 0 refills | Status: AC
Start: 2019-08-02 — End: 2019-08-09

## 2019-08-02 MED ORDER — KETOROLAC TROMETHAMINE 10 MG PO TABS: 10.0000 mg | ORAL_TABLET | Freq: Three times a day (TID) | ORAL | 0 refills | Status: DC | PRN

## 2019-08-02 MED ORDER — OXYCODONE-ACETAMINOPHEN 5-325 MG PO TABS: 1.0000 | ORAL_TABLET | Freq: Three times a day (TID) | ORAL | 0 refills | Status: DC | PRN

## 2019-08-02 NOTE — Progress Notes (Signed)
Discharge instructions provided to the patient. He denies any questions and understands his medications for pick up at home.

## 2019-08-02 NOTE — Discharge Summary (Signed)
Physician Discharge Summary  Patient ID: Derrick Barr MRN: [id number] DOB/AGE: Jan 26, 1970 49 y.o.  Admit date: 07/31/2019 Discharge date: 08/02/2019  Admission Diagnoses: ureteral stone, pyelonephritis  Discharge Diagnoses:  same  Discharged Condition: good  Hospital Course:   1 - Recurrent Urolithiasis-  Pre 2021 - MET x 2 07/2019 - Rt 46m proximal stone and bilateral scattered renal stones (abour 462mx 3 each side) on ER Ct on eval flank pain and malaise. Bilateral stents placed urently 07/31/19.   2 - Pyelonephritis- fevers, rigors, chills x few days, EBC 15.9k Cr 0.9, lactate 1.4 c/w early obstructing pyelo. UCX,BCX 7/10 pending. UA with pyuria. Placed on empiric Rocephin. By AM of 7/12 afebrile x 24 hours and transitioned to PO augmentin for home.   By the AM of 08/02/19, he is ambulatory, voiding, tollerating PO nutrition, afebrile x 24 hours, and felt to be adequtae for discharge.    Consults: None  Significant Diagnostic Studies: labs: as per above  Treatments: rocephin, bilateral stent placemnt.   Discharge Exam: Blood pressure 128/83, pulse 63, temperature 97.7 F (36.5 C), resp. rate 18, height 5' 11" (1.803 m), weight 101.6 kg, SpO2 96 %.   HENT:  Head: Normocephalic.  Nose: Nose normal.  Mouth/Throat:  Mouth: Mucous membranes are moist.  Cardiovascular:  Rate and Rhythm: Normal rate.  Pulses: Normal pulses.  Pulmonary:  Effort: Pulmonary effort is normal.  Abdominal:  General: Abdomen is flat.  Comments: Mild obesity. Genitourinary: Comments: Improved Rt CVAT Musculoskeletal:  General: Normal range of motion.  Cervical back: Normal range of motion.  Skin: General: Skin is warm.  Neurological:  General: No focal deficitpresent.  Mental Status: He is alert.  Psychiatric:  Mood and Affect: Moodnormal.  Disposition:      Follow-up Information    MaAlexis FrockMD Follow up.    Specialty: Urology Why: Office will call to arrange next stage surgery.  Contact information: [address] [phone]             Signed: ThAlexis Frock/12/2019, 7:12 AM

## 2019-08-02 NOTE — Discharge Instructions (Signed)
1 - You may have urinary urgency (bladder spasms) and bloody urine on / off with stent in place. This is normal. ° °2 - Call MD or go to ER for fever >102, severe pain / nausea / vomiting not relieved by medications, or acute change in medical status ° °

## 2019-08-05 ENCOUNTER — Other Ambulatory Visit: Payer: Self-pay | Admitting: Urology

## 2019-08-05 LAB — CULTURE, BLOOD (ROUTINE X 2)
Culture: NO GROWTH
Culture: NO GROWTH
Special Requests: ADEQUATE
Special Requests: ADEQUATE

## 2019-08-06 ENCOUNTER — Other Ambulatory Visit: Payer: Self-pay

## 2019-08-06 ENCOUNTER — Ambulatory Visit
Admission: EM | Admit: 2019-08-06 | Discharge: 2019-08-06 | Disposition: A | Discharge status: Home / Self Care | Payer: 59 | Attending: Emergency Medicine | Admitting: Emergency Medicine

## 2019-08-06 DIAGNOSIS — L239 Allergic contact dermatitis, unspecified cause: Secondary | ICD-10-CM | POA: Diagnosis not present

## 2019-08-06 MED ORDER — METHYLPREDNISOLONE SODIUM SUCC 125 MG IJ SOLR
80.0000 mg | Freq: Once | INTRAMUSCULAR | Status: AC
  Administered 2019-08-06: 80 mg via INTRAMUSCULAR

## 2019-08-06 MED ORDER — PREDNISONE 10 MG (21) PO TBPK
ORAL_TABLET | ORAL | 0 refills | Status: DC
Start: 2019-08-06 — End: 2019-08-18

## 2019-08-06 NOTE — ED Provider Notes (Signed)
[facility]   [id number] 08/06/19 Arrival Time: 1621   Chief Complaint  Patient presents with  . Rash     SUBJECTIVE: History from: patient.  Derrick Barr is a 49 y.o. male who presented to the urgent care with a complaint of rash that started today.report he was on Augmentin and just completed the antibiotic today.  Localized rash to his chest, back, arm and hand.described the rash as red and itchy.  Has tried OTC Benadryl with mild relief.  Denies aggravating or alleviating factors.denies similar symptoms in the past.  Denies chills, fever, nausea, vomiting, diarrhea.  ROS: As per HPI.  All other pertinent ROS negative.      Past Medical History:  Diagnosis Date  . Anxiety   . Arthritis    Gout  . Depression   . Gout   . History of kidney stones   . Pneumonia    as a child   Past Surgical History:  Procedure Laterality Date  . CYSTOSCOPY WITH RETROGRADE PYELOGRAM, URETEROSCOPY AND STENT PLACEMENT Bilateral 07/31/2019   Procedure: CYSTOSCOPY WITH RETROGRADE PYELOGRAM, URETEROSCOPY AND STENT PLACEMENT;  Surgeon: Sebastian Ache, MD;  Location: [facility];  Service: Urology;  Laterality: Bilateral;  . EYE SURGERY     BB 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  . TOTAL HIP ARTHROPLASTY Left 06/28/2019   Procedure: LEFT TOTAL HIP ARTHROPLASTY ANTERIOR APPROACH;  Surgeon: Tarry Kos, MD;  Location: [facility];  Service: Orthopedics;  Laterality: Left;  . WISDOM TOOTH EXTRACTION     No Known Allergies No current facility-administered medications on file prior to encounter.   Current Outpatient Medications on File Prior to Encounter  Medication Sig Dispense Refill  . allopurinol (ZYLOPRIM) 100 MG tablet TAKE 1 TABLET TWICE A DAY (Patient taking differently: Take 100 mg by mouth daily. ) 60 tablet 6  . amoxicillin-clavulanate (AUGMENTIN) 875-125 MG tablet Take 1 tablet by mouth 2 (two) times daily for 7 days. Take for 5 days now. Also begin 2 days before next Urology surgery. 14  tablet 0  . colchicine 0.6 MG tablet Take 1 tablet (0.6 mg total) by mouth 2 (two) times daily as needed. 20 tablet 0  . diclofenac Sodium (VOLTAREN) 1 % GEL Apply 2 g topically 2 (two) times daily as needed (joint pain).    . febuxostat (ULORIC) 40 MG tablet Take 1 tablet (40 mg total) by mouth daily. 30 tablet 3  . ketorolac (TORADOL) 10 MG tablet Take 1 tablet (10 mg total) by mouth every 8 (eight) hours as needed for moderate pain. Or stent discomfort post-operatively. 20 tablet 0  . methocarbamol (ROBAXIN) 750 MG tablet Take 1 tablet (750 mg total) by mouth 2 (two) times daily as needed for muscle spasms. 20 tablet 3  . oxyCODONE-acetaminophen (PERCOCET) 5-325 MG tablet Take 1 tablet by mouth every 8 (eight) hours as needed for severe pain. Post-operatively 15 tablet 0  . senna-docusate (SENOKOT S) 8.6-50 MG tablet Take 1-2 tablets by mouth at bedtime as needed. 30 tablet 1  . sertraline (ZOLOFT) 50 MG tablet Take 50 mg by mouth daily.    . traZODone (DESYREL) 50 MG tablet Take 50 mg by mouth at bedtime as needed for sleep.     Social History   Socioeconomic History  . Marital status: Divorced    Spouse name: Not on file  . Number of children: Not on file  . Years of education: Not on file  . Highest education level: Not on file  Occupational History  . Not on file  Tobacco Use  . Smoking status: Never Smoker  . Smokeless tobacco: Never Used  Vaping Use  . Vaping Use: Never used  Substance and Sexual Activity  . Alcohol use: No    Alcohol/week: 0.0 standard drinks  . Drug use: No  . Sexual activity: Yes  Other Topics Concern  . Not on file  Social History Narrative  . Not on file   Social Determinants of Health   Financial Resource Strain:   . Difficulty of Paying Living Expenses:   Food Insecurity:   . Worried About Programme researcher, broadcasting/film/video in the Last Year:   . Barista in the Last Year:   Transportation Needs:   . Freight forwarder (Medical):   Marland Kitchen Lack of  Transportation (Non-Medical):   Physical Activity:   . Days of Exercise per Week:   . Minutes of Exercise per Session:   Stress:   . Feeling of Stress :   Social Connections:   . Frequency of Communication with Friends and Family:   . Frequency of Social Gatherings with Friends and Family:   . Attends Religious Services:   . Active Member of Clubs or Organizations:   . Attends Banker Meetings:   Marland Kitchen Marital Status:   Intimate Partner Violence:   . Fear of Current or Ex-Partner:   . Emotionally Abused:   Marland Kitchen Physically Abused:   . Sexually Abused:    History reviewed. No pertinent family history.  OBJECTIVE:  Vitals:   08/06/19 1646  BP: 118/81  Pulse: 92  Resp: 14  Temp: 98 F (36.7 C)  TempSrc: Oral  SpO2: 97%     Physical Exam Vitals and nursing note reviewed.  Constitutional:      General: He is not in acute distress.    Appearance: Normal appearance. He is normal weight. He is not ill-appearing, toxic-appearing or diaphoretic.  Cardiovascular:     Rate and Rhythm: Normal rate and regular rhythm.     Pulses: Normal pulses.     Heart sounds: Normal heart sounds. No murmur heard.  No gallop.   Pulmonary:     Effort: Pulmonary effort is normal. No respiratory distress.     Breath sounds: Normal breath sounds. No stridor. No wheezing, rhonchi or rales.  Chest:     Chest wall: No tenderness.  Skin:    General: Skin is warm.     Findings: Rash present. Rash is macular.  Neurological:     Mental Status: He is alert.     LABS:  No results found for this or any previous visit (from the past 24 hour(s)).   ASSESSMENT & PLAN:  1. Allergic dermatitis     Meds ordered this encounter  Medications  . predniSONE (STERAPRED UNI-PAK 21 TAB) 10 MG (21) TBPK tablet    Sig: Take 6 tabs by mouth daily  for 1 days, then 5 tabs for 1days, then 4 tabs for 1 days, then 3 tabs for 1 days, 1 tabs for 1 days, then 1 tab by mouth daily for 1 days    Dispense:  21  tablet    Refill:  0    Discharge instructions  Prescribed prednisone Continue to take Benadryl Take as prescribed and to completion Follow-up with PCP for medication management and reevaluation Limit hot shower and baths, or bathe with warm water.   Moisturize skin daily Follow up with PCP if symptoms persists Return or  go to the ER if you have any new or worsening symptoms  Reviewed expectations re: course of current medical issues. Questions answered. Outlined signs and symptoms indicating need for more acute intervention. Patient verbalized understanding. After Visit Summary given.      Note: This document was prepared using Dragon voice recognition software and may include unintentional dictation errors.    Durward Parcel, FNP 08/06/19 1755

## 2019-08-06 NOTE — Discharge Instructions (Addendum)
Prescribed prednisone Continue to take Benadryl Take as prescribed and to completion Follow-up with PCP for medication management and reevaluation Limit hot shower and baths, or bathe with warm water.   Moisturize skin daily Follow up with PCP if symptoms persists Return or go to the ER if you have any new or worsening symptoms

## 2019-08-06 NOTE — ED Triage Notes (Signed)
Patient was in the hospital for kidney stones last week, today is last day of augmentin, has broken out in an itchy rash today.

## 2019-08-11 ENCOUNTER — Other Ambulatory Visit: Payer: Self-pay

## 2019-08-11 ENCOUNTER — Encounter (HOSPITAL_BASED_OUTPATIENT_CLINIC_OR_DEPARTMENT_OTHER): Payer: Self-pay | Admitting: Urology

## 2019-08-11 NOTE — Progress Notes (Signed)
Spoke w/ via phone for pre-op interview--- PT Lab needs dos----  Istat (gent )             Lab results------ no COVID test ------ 08-14-2019 @ 1330 Arrive at ------- 1000 NPO after MN NO Solid Food.  Clear liquids from MN until--- 0900 then nothing by mouth Medications to take morning of surgery ----- Zoloft, Uloric, and if needed may take oxycodone w/ sips of water Diabetic medication ----- n/a Patient Special Instructions ----- n/a Pre-Op special Istructions ----- n/a Patient verbalized understanding of instructions that were given at this phone interview. Patient denies shortness of breath, chest pain, fever, cough a this phone interview.

## 2019-08-14 ENCOUNTER — Other Ambulatory Visit (HOSPITAL_COMMUNITY): Payer: 59

## 2019-08-16 ENCOUNTER — Other Ambulatory Visit (HOSPITAL_COMMUNITY)
Admission: RE | Admit: 2019-08-16 | Discharge: 2019-08-16 | Disposition: A | Discharge status: Home / Self Care | Payer: 59 | Source: Ambulatory Visit | Attending: Urology | Admitting: Urology

## 2019-08-16 DIAGNOSIS — Z01812 Encounter for preprocedural laboratory examination: Secondary | ICD-10-CM | POA: Diagnosis present

## 2019-08-16 DIAGNOSIS — Z20822 Contact with and (suspected) exposure to covid-19: Secondary | ICD-10-CM | POA: Insufficient documentation

## 2019-08-16 LAB — SARS CORONAVIRUS 2 (TAT 6-24 HRS): SARS Coronavirus 2: NEGATIVE

## 2019-08-18 ENCOUNTER — Ambulatory Visit (HOSPITAL_BASED_OUTPATIENT_CLINIC_OR_DEPARTMENT_OTHER)
Admission: RE | Admit: 2019-08-18 | Discharge: 2019-08-18 | Disposition: A | Discharge status: Home / Self Care | Payer: 59 | Attending: Urology | Admitting: Urology

## 2019-08-18 ENCOUNTER — Ambulatory Visit (HOSPITAL_BASED_OUTPATIENT_CLINIC_OR_DEPARTMENT_OTHER): Payer: 59 | Admitting: Certified Registered Nurse Anesthetist

## 2019-08-18 ENCOUNTER — Encounter (HOSPITAL_BASED_OUTPATIENT_CLINIC_OR_DEPARTMENT_OTHER): Payer: Self-pay | Admitting: Urology

## 2019-08-18 ENCOUNTER — Encounter (HOSPITAL_BASED_OUTPATIENT_CLINIC_OR_DEPARTMENT_OTHER)
Admission: RE | Disposition: A | Discharge status: Home / Self Care | Payer: Self-pay | Source: Home / Self Care | Attending: Urology

## 2019-08-18 ENCOUNTER — Other Ambulatory Visit: Payer: Self-pay

## 2019-08-18 DIAGNOSIS — F419 Anxiety disorder, unspecified: Secondary | ICD-10-CM | POA: Diagnosis not present

## 2019-08-18 DIAGNOSIS — M109 Gout, unspecified: Secondary | ICD-10-CM | POA: Diagnosis not present

## 2019-08-18 DIAGNOSIS — Z7952 Long term (current) use of systemic steroids: Secondary | ICD-10-CM | POA: Insufficient documentation

## 2019-08-18 DIAGNOSIS — Z96642 Presence of left artificial hip joint: Secondary | ICD-10-CM | POA: Diagnosis not present

## 2019-08-18 DIAGNOSIS — Z87442 Personal history of urinary calculi: Secondary | ICD-10-CM | POA: Insufficient documentation

## 2019-08-18 DIAGNOSIS — N202 Calculus of kidney with calculus of ureter: Secondary | ICD-10-CM | POA: Insufficient documentation

## 2019-08-18 DIAGNOSIS — F329 Major depressive disorder, single episode, unspecified: Secondary | ICD-10-CM | POA: Diagnosis not present

## 2019-08-18 DIAGNOSIS — Z20822 Contact with and (suspected) exposure to covid-19: Secondary | ICD-10-CM | POA: Diagnosis not present

## 2019-08-18 DIAGNOSIS — Z79899 Other long term (current) drug therapy: Secondary | ICD-10-CM | POA: Insufficient documentation

## 2019-08-18 DIAGNOSIS — N200 Calculus of kidney: Secondary | ICD-10-CM | POA: Diagnosis present

## 2019-08-18 HISTORY — DX: Unspecified osteoarthritis, unspecified site: M19.90

## 2019-08-18 HISTORY — DX: Calculus of kidney: N20.0

## 2019-08-18 HISTORY — PX: CYSTOSCOPY WITH RETROGRADE PYELOGRAM, URETEROSCOPY AND STENT PLACEMENT: SHX5789

## 2019-08-18 HISTORY — DX: Chronic gout, unspecified, without tophus (tophi): M1A.9XX0

## 2019-08-18 HISTORY — DX: Urgency of urination: R39.15

## 2019-08-18 HISTORY — DX: Hematuria, unspecified: R31.9

## 2019-08-18 HISTORY — PX: HOLMIUM LASER APPLICATION: SHX5852

## 2019-08-18 HISTORY — DX: Dysuria: R30.0

## 2019-08-18 LAB — POCT I-STAT, CHEM 8
BUN: 13 mg/dL (ref 6–20)
Calcium, Ion: 1.23 mmol/L (ref 1.15–1.40)
Chloride: 100 mmol/L (ref 98–111)
Creatinine, Ser: 0.7 mg/dL (ref 0.61–1.24)
Glucose, Bld: 123 mg/dL — ABNORMAL HIGH (ref 70–99)
HCT: 43 % (ref 39.0–52.0)
Hemoglobin: 14.6 g/dL (ref 13.0–17.0)
Potassium: 4.6 mmol/L (ref 3.5–5.1)
Sodium: 139 mmol/L (ref 135–145)
TCO2: 26 mmol/L (ref 22–32)

## 2019-08-18 SURGERY — CYSTOURETEROSCOPY, WITH RETROGRADE PYELOGRAM AND STENT INSERTION
Anesthesia: General | Site: Ureter | Laterality: Bilateral

## 2019-08-18 MED ORDER — DEXAMETHASONE SODIUM PHOSPHATE 10 MG/ML IJ SOLN
INTRAMUSCULAR | Status: DC | PRN
  Administered 2019-08-18: 5 mg via INTRAVENOUS

## 2019-08-18 MED ORDER — LACTATED RINGERS IV SOLN: INTRAVENOUS | Status: DC

## 2019-08-18 MED ORDER — PROPOFOL 10 MG/ML IV BOLUS
INTRAVENOUS | Status: DC | PRN
  Administered 2019-08-18: 200 mg via INTRAVENOUS

## 2019-08-18 MED ORDER — FENTANYL CITRATE (PF) 100 MCG/2ML IJ SOLN
INTRAMUSCULAR | Status: AC
  Filled 2019-08-18: qty 2

## 2019-08-18 MED ORDER — MIDAZOLAM HCL 2 MG/2ML IJ SOLN
INTRAMUSCULAR | Status: AC
  Filled 2019-08-18: qty 2

## 2019-08-18 MED ORDER — LIDOCAINE 2% (20 MG/ML) 5 ML SYRINGE
INTRAMUSCULAR | Status: AC
  Filled 2019-08-18: qty 5

## 2019-08-18 MED ORDER — PROPOFOL 10 MG/ML IV BOLUS
INTRAVENOUS | Status: AC
  Filled 2019-08-18: qty 20

## 2019-08-18 MED ORDER — MIDAZOLAM HCL 2 MG/2ML IJ SOLN
INTRAMUSCULAR | Status: DC | PRN
  Administered 2019-08-18: 2 mg via INTRAVENOUS

## 2019-08-18 MED ORDER — KETOROLAC TROMETHAMINE 10 MG PO TABS: 10.0000 mg | ORAL_TABLET | Freq: Three times a day (TID) | ORAL | 0 refills | Status: DC | PRN

## 2019-08-18 MED ORDER — OXYCODONE-ACETAMINOPHEN 5-325 MG PO TABS: 1.0000 | ORAL_TABLET | Freq: Three times a day (TID) | ORAL | 0 refills | Status: AC | PRN

## 2019-08-18 MED ORDER — NITROFURANTOIN MONOHYD MACRO 100 MG PO CAPS
100.0000 mg | ORAL_CAPSULE | Freq: Two times a day (BID) | ORAL | 0 refills | Status: AC
Start: 2019-08-18 — End: 2019-08-21

## 2019-08-18 MED ORDER — PHENYLEPHRINE 40 MCG/ML (10ML) SYRINGE FOR IV PUSH (FOR BLOOD PRESSURE SUPPORT)
PREFILLED_SYRINGE | INTRAVENOUS | Status: DC | PRN
  Administered 2019-08-18 (×3): 80 ug via INTRAVENOUS

## 2019-08-18 MED ORDER — GENTAMICIN SULFATE 40 MG/ML IJ SOLN
5.0000 mg/kg | INTRAVENOUS | Status: AC
  Administered 2019-08-18: 430 mg via INTRAVENOUS
  Filled 2019-08-18: qty 10.75

## 2019-08-18 MED ORDER — ONDANSETRON HCL 4 MG/2ML IJ SOLN
INTRAMUSCULAR | Status: DC | PRN
  Administered 2019-08-18: 4 mg via INTRAVENOUS

## 2019-08-18 MED ORDER — EPHEDRINE SULFATE-NACL 50-0.9 MG/10ML-% IV SOSY
PREFILLED_SYRINGE | INTRAVENOUS | Status: DC | PRN
  Administered 2019-08-18 (×2): 5 mg via INTRAVENOUS

## 2019-08-18 MED ORDER — ONDANSETRON HCL 4 MG/2ML IJ SOLN
INTRAMUSCULAR | Status: AC
  Filled 2019-08-18: qty 2

## 2019-08-18 MED ORDER — EPHEDRINE 5 MG/ML INJ
INTRAVENOUS | Status: AC
  Filled 2019-08-18: qty 10

## 2019-08-18 MED ORDER — SODIUM CHLORIDE 0.9 % IR SOLN
Status: DC | PRN
  Administered 2019-08-18: 3000 mL

## 2019-08-18 MED ORDER — FENTANYL CITRATE (PF) 100 MCG/2ML IJ SOLN
INTRAMUSCULAR | Status: DC | PRN
  Administered 2019-08-18: 50 ug via INTRAVENOUS
  Administered 2019-08-18: 25 ug via INTRAVENOUS
  Administered 2019-08-18 (×2): 50 ug via INTRAVENOUS

## 2019-08-18 MED ORDER — HYDROMORPHONE HCL 1 MG/ML IJ SOLN: 0.2500 mg | INTRAMUSCULAR | Status: DC | PRN

## 2019-08-18 MED ORDER — LIDOCAINE HCL (CARDIAC) PF 100 MG/5ML IV SOSY
PREFILLED_SYRINGE | INTRAVENOUS | Status: DC | PRN
  Administered 2019-08-18: 80 mg via INTRAVENOUS

## 2019-08-18 SURGICAL SUPPLY — 27 items
BAG DRAIN URO-CYSTO SKYTR STRL (DRAIN) ×2 IMPLANT
BAG DRN UROCATH (DRAIN) ×1
BASKET LASER NITINOL 1.9FR (BASKET) ×4 IMPLANT
BSKT STON RTRVL 120 1.9FR (BASKET) ×2
CATH INTERMIT  6FR 70CM (CATHETERS) ×2 IMPLANT
CLOTH BEACON ORANGE TIMEOUT ST (SAFETY) ×2 IMPLANT
DRSG TEGADERM 2-3/8X2-3/4 SM (GAUZE/BANDAGES/DRESSINGS) ×2 IMPLANT
FIBER LASER FLEXIVA 365 (UROLOGICAL SUPPLIES) IMPLANT
FIBER LASER TRAC TIP (UROLOGICAL SUPPLIES) ×2 IMPLANT
GLOVE BIO SURGEON STRL SZ7.5 (GLOVE) ×2 IMPLANT
GLOVE BIOGEL PI IND STRL 7.0 (GLOVE) ×2 IMPLANT
GLOVE BIOGEL PI INDICATOR 7.0 (GLOVE) ×2
GOWN STRL REUS W/TWL LRG LVL3 (GOWN DISPOSABLE) ×4 IMPLANT
GUIDEWIRE ANG ZIPWIRE 038X150 (WIRE) ×4 IMPLANT
GUIDEWIRE STR DUAL SENSOR (WIRE) ×4 IMPLANT
IV NS 1000ML (IV SOLUTION)
IV NS 1000ML BAXH (IV SOLUTION) IMPLANT
IV NS IRRIG 3000ML ARTHROMATIC (IV SOLUTION) ×2 IMPLANT
KIT TURNOVER CYSTO (KITS) ×2 IMPLANT
MANIFOLD NEPTUNE II (INSTRUMENTS) ×2 IMPLANT
NS IRRIG 500ML POUR BTL (IV SOLUTION) ×2 IMPLANT
PACK CYSTO (CUSTOM PROCEDURE TRAY) ×2 IMPLANT
STENT POLARIS LOOP 6FR X 26 CM (STENTS) ×4 IMPLANT
SYR 10ML LL (SYRINGE) ×2 IMPLANT
TUBE CONNECTING 12X1/4 (SUCTIONS) ×2 IMPLANT
TUBE FEEDING 8FR 16IN STR KANG (MISCELLANEOUS) IMPLANT
TUBING UROLOGY SET (TUBING) ×2 IMPLANT

## 2019-08-18 NOTE — Transfer of Care (Signed)
Immediate Anesthesia Transfer of Care Note  Patient: Derrick Barr  Procedure(s) Performed: CYSTOSCOPY WITH RETROGRADE PYELOGRAM, URETEROSCOPY AND STENT PLACEMENT (Bilateral Ureter) HOLMIUM LASER APPLICATION (Bilateral Ureter)  Patient Location: PACU  Anesthesia Type:General  Level of Consciousness: drowsy  Airway & Oxygen Therapy: Patient Spontanous Breathing and Patient connected to nasal cannula oxygen  Post-op Assessment: Report given to RN and Post -op Vital signs reviewed and stable  Post vital signs: Reviewed and stable  Last Vitals:  Vitals Value Taken Time  BP 136/90   Temp 36.6 C 08/18/19 1344  Pulse 93 08/18/19 1347  Resp 18 08/18/19 1347  SpO2 95 % 08/18/19 1347  Vitals shown include unvalidated device data.  Last Pain:  Vitals:   08/18/19 1016  TempSrc: Oral  PainSc: 1       Patients Stated Pain Goal: 4 (08/18/19 1016)  Complications: No complications documented.

## 2019-08-18 NOTE — Discharge Instructions (Signed)
1 - You may have urinary urgency (bladder spasms) and bloody urine on / off with stent in place. This is normal.  2 - Remove tethered stents on Friday morning at home by pulling on strings, then blue-white plastic tubing and discarding. There are TWO stents. Office is open Friday if any problems arise.   3 - Call MD or go to ER for fever >102, severe pain / nausea / vomiting not relieved by medications, or acute change in medical status  Ureteral Stent Implantation, Care After This sheet gives you information about how to care for yourself after your procedure. Your health care provider may also give you more specific instructions. If you have problems or questions, contact your health care provider. What can I expect after the procedure? After the procedure, it is common to have:  Nausea.  Mild pain when you urinate. You may feel this pain in your lower back or lower abdomen. The pain should stop within a few minutes after you urinate. This may last for up to 1 week.  A small amount of blood in your urine for several days. Follow these instructions at home: Medicines  Take over-the-counter and prescription medicines only as told by your health care provider.  If you were prescribed an antibiotic medicine, take it as told by your health care provider. Do not stop taking the antibiotic even if you start to feel better.  Do not drive for 24 hours if you were given a sedative during your procedure.  Ask your health care provider if the medicine prescribed to you requires you to avoid driving or using heavy machinery. Activity  Rest as told by your health care provider.  Avoid sitting for a long time without moving. Get up to take short walks every 1-2 hours. This is important to improve blood flow and breathing. Ask for help if you feel weak or unsteady.  Return to your normal activities as told by your health care provider. Ask your health care provider what activities are safe for  you. General instructions   Watch for any blood in your urine. Call your health care provider if the amount of blood in your urine increases.  If you have a catheter: ? Follow instructions from your health care provider about taking care of your catheter and collection bag. ? Do not take baths, swim, or use a hot tub until your health care provider approves. Ask your health care provider if you may take showers. You may only be allowed to take sponge baths.  Drink enough fluid to keep your urine pale yellow.  Do not use any products that contain nicotine or tobacco, such as cigarettes, e-cigarettes, and chewing tobacco. These can delay healing after surgery. If you need help quitting, ask your health care provider.  Keep all follow-up visits as told by your health care provider. This is important. Contact a health care provider if:  You have pain that gets worse or does not get better with medicine, especially pain when you urinate.  You have difficulty urinating.  You feel nauseous or you vomit repeatedly during a period of more than 2 days after the procedure. Get help right away if:  Your urine is dark red or has blood clots in it.  You are leaking urine (have incontinence).  The end of the stent comes out of your urethra.  You cannot urinate.  You have sudden, sharp, or severe pain in your abdomen or lower back.  You have a fever.  You have swelling or pain in your legs.  You have difficulty breathing. Summary  After the procedure, it is common to have mild pain when you urinate that goes away within a few minutes after you urinate. This may last for up to 1 week.  Watch for any blood in your urine. Call your health care provider if the amount of blood in your urine increases.  Take over-the-counter and prescription medicines only as told by your health care provider.  Drink enough fluid to keep your urine pale yellow. This information is not intended to replace  advice given to you by your health care provider. Make sure you discuss any questions you have with your health care provider. Document Revised: 10/14/2017 Document Reviewed: 10/15/2017 Elsevier Patient Education  2020 [employer].   Post Anesthesia Home Care Instructions  Activity: Get plenty of rest for the remainder of the day. A responsible individual must stay with you for 24 hours following the procedure.  For the next 24 hours, DO NOT: -Drive a car -Advertising copywriter -Drink alcoholic beverages -Take any medication unless instructed by your physician -Make any legal decisions or sign important papers.  Meals: Start with liquid foods such as gelatin or soup. Progress to regular foods as tolerated. Avoid greasy, spicy, heavy foods. If nausea and/or vomiting occur, drink only clear liquids until the nausea and/or vomiting subsides. Call your physician if vomiting continues.  Special Instructions/Symptoms: Your throat may feel dry or sore from the anesthesia or the breathing tube placed in your throat during surgery. If this causes discomfort, gargle with warm salt water. The discomfort should disappear within 24 hours.  If you had a scopolamine patch placed behind your ear for the management of post- operative nausea and/or vomiting:  1. The medication in the patch is effective for 72 hours, after which it should be removed.  Wrap patch in a tissue and discard in the trash. Wash hands thoroughly with soap and water. 2. You may remove the patch earlier than 72 hours if you experience unpleasant side effects which may include dry mouth, dizziness or visual disturbances. 3. Avoid touching the patch. Wash your hands with soap and water after contact with the patch.

## 2019-08-18 NOTE — Anesthesia Procedure Notes (Signed)
Procedure Name: LMA Insertion Date/Time: 08/18/2019 12:05 PM Performed by: Yolonda Kida, CRNA Pre-anesthesia Checklist: Patient identified, Emergency Drugs available, Suction available and Patient being monitored Patient Re-evaluated:Patient Re-evaluated prior to induction Oxygen Delivery Method: Circle system utilized Preoxygenation: Pre-oxygenation with 100% oxygen Induction Type: IV induction LMA: LMA inserted LMA Size: 4.0 Number of attempts: 1 Placement Confirmation: positive ETCO2 and breath sounds checked- equal and bilateral Tube secured with: Tape Dental Injury: Teeth and Oropharynx as per pre-operative assessment

## 2019-08-18 NOTE — Brief Op Note (Signed)
08/18/2019  1:31 PM  PATIENT:  Derrick Barr  49 y.o. male  PRE-OPERATIVE DIAGNOSIS:  BILATERAL RENAL STONES  POST-OPERATIVE DIAGNOSIS:  BILATERAL RENAL STONES  PROCEDURE:  Procedure(s) with comments: CYSTOSCOPY WITH RETROGRADE PYELOGRAM, URETEROSCOPY AND STENT PLACEMENT (Bilateral) - 75 MINS HOLMIUM LASER APPLICATION (Bilateral)  SURGEON:  Surgeon(s) and Role:    Sebastian Ache, MD - Primary  PHYSICIAN ASSISTANT:   ASSISTANTS: none   ANESTHESIA:   general  EBL:  minimal   BLOOD ADMINISTERED:none  DRAINS: none   LOCAL MEDICATIONS USED:  NONE  SPECIMEN:  Source of Specimen:  bilateral ureteral and renal stone fragments.   DISPOSITION OF SPECIMEN:  [facility] for compositional analysis  COUNTS:  YES  TOURNIQUET:  * No tourniquets in log *  DICTATION: .Other Dictation: Dictation Number  [id number]  PLAN OF CARE: Discharge to home after PACU  PATIENT DISPOSITION:  PACU - hemodynamically stable.   Delay start of Pharmacological VTE agent (>24hrs) due to surgical blood loss or risk of bleeding: yes

## 2019-08-18 NOTE — Progress Notes (Signed)
The iv was started at 1015

## 2019-08-18 NOTE — H&P (Signed)
Derrick Barr is an 49 y.o. male.    Chief Complaint: Pre-Op BILATERAL Ureteroscopic Stone Manipulation  HPI:    1 - Recurrent Urolithiasis -  Pre 2021 - MET x 2 07/2019 - Rt 19m proximal stone and bilateral scattered renal stones (abour 462mx 3 each side) on ER Ct on eval flank pain and malaise ==> Bilateral stents.   2 - Pyelonephritis - fevers, rigors, chills x few days, EBC 15.9k Cr 0.9, lactate 1.4 c/w early obstructing pyelo. UCX,BCX 7/10 negative. UA with pyuria. Placed on empiric Rocephin.   PMH sig for gout, left hip replacement. NO ischemic CV disease / blood thinners.  Today "DwSekouis seen to proceed with BILATERAL ureteroscopic stone manipulation. No interval fevers.   Past Medical History:  Diagnosis Date  . Anxiety   . Chronic gout followed by pcp   08-11-2019 per pt last episode left knee 06/ 2021  . Depression   . Dysuria   . Hematuria   . History of kidney stones   . OA (osteoarthritis)    knees, hips  . Renal calculus, bilateral   . Urgency of urination     Past Surgical History:  Procedure Laterality Date  . CYSTOSCOPY WITH RETROGRADE PYELOGRAM, URETEROSCOPY AND STENT PLACEMENT Bilateral 07/31/2019   Procedure: CYSTOSCOPY WITH RETROGRADE PYELOGRAM, URETEROSCOPY AND STENT PLACEMENT;  Surgeon: MaAlexis FrockMD;  Location: [facility];  Service: Urology;  Laterality: Bilateral;  . EYE SURGERY Left age [age] BB 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  . TOTAL HIP ARTHROPLASTY Left 06/28/2019   Procedure: LEFT TOTAL HIP ARTHROPLASTY ANTERIOR APPROACH;  Surgeon: XuLeandrew KoyanagiMD;  [facility];  Laterality: Left;  . WISDOM TOOTH EXTRACTION      History reviewed. No pertinent family history. Social History:  reports that he has never smoked. He has never used smokeless tobacco. He reports that he does not drink alcohol and does not use drugs.  Allergies:  Allergies  Allergen Reactions  . Augmentin [Amoxicillin-Pot Clavulanate] Hives    No  medications prior to admission.    Results for orders placed or performed during the hospital encounter of 08/16/19 (from the past 48 hour(s))  SARS CORONAVIRUS 2 (TAT 6-24 HRS) Nasopharyngeal Nasopharyngeal Swab     Status: None   Collection Time: 08/16/19 10:20 AM   Specimen: Nasopharyngeal Swab  Result Value Ref Range   SARS Coronavirus 2 NEGATIVE NEGATIVE    Comment: (NOTE) SARS-CoV-2 target nucleic acids are NOT DETECTED.  The SARS-CoV-2 RNA is generally detectable in upper and lower respiratory specimens during the acute phase of infection. Negative results do not preclude SARS-CoV-2 infection, do not rule out co-infections with other pathogens, and should not be used as the sole basis for treatment or other patient management decisions. Negative results must be combined with clinical observations, patient history, and epidemiological information. The expected result is Negative.  Fact Sheet for Patients: htSugarRoll.beFact Sheet for Healthcare Providers: hthttps://www.woods-mathews.com/This test is not yet approved or cleared by the [place] and  has been authorized for detection and/or diagnosis of SARS-CoV-2 by FDA under an Emergency Use Authorization (EUA). This EUA will remain  in effect (meaning this test can be used) for the duration of the COVID-19 declaration under Se ction 564(b)(1) of the Act, 21 U.S.C. section 360bbb-3(b)(1), unless the authorization is terminated or revoked sooner.  Performed at [facility] [place] [id number]  No results found.  Review of Systems  Constitutional: Negative for chills and fatigue.  Genitourinary: Positive for urgency.  All other systems reviewed and are negative.   Height 5' 11"  (1.803 m), weight 101.6 kg. Physical Exam Vitals reviewed.  HENT:     Head: Normocephalic.  Eyes:     Pupils: Pupils are equal, round, and reactive to  light.  Cardiovascular:     Pulses: Normal pulses.  Abdominal:     General: Abdomen is flat.  Genitourinary:    Comments: NO  CVAT at present.  Musculoskeletal:        General: Normal range of motion.     Cervical back: Normal range of motion.  Skin:    General: Skin is warm.  Neurological:     Mental Status: He is alert.      Assessment/Plan  Proceed as planned with cysto, BILATERAL retrogrades / ureteroscopy / laser / stent exchange with goal of stone free. Risks, benefits, alternatives, expected peri-op course discussed previously and reiterated today.   Alexis Frock, MD 08/18/2019, 8:24 AM

## 2019-08-18 NOTE — Op Note (Signed)
NAMECLAUDE, WALDMAN MEDICAL RECORD [id number] ACCOUNT [id number] DATE OF BIRTH:Jun 05, 1970 FACILITY: [facility] LOCATION: [facility] PHYSICIAN:Karli Wickizer Berneice Heinrich, MD  OPERATIVE REPORT  DATE OF PROCEDURE:  08/18/2019  SURGEON:  Sebastian Ache, MD  PREOPERATIVE DIAGNOSIS:  Bilateral ureteral and renal stone.  PROCEDURE: 1.  Cystoscopy, bilateral pyelograms, interpretation. 2.  Bilateral ureteroscopy with laser lithotripsy. 3.  Exchange of bilateral ureteral stents, 5 x 26 Polaris with tether.  ESTIMATED BLOOD LOSS:  Nil.  COMPLICATIONS:  None.  SPECIMENS:  Bilateral ureteral and renal stone fragments for composition analysis.  FINDINGS: 1.  Right greater than left ureteral stones. 2.  Bilateral papillary tip calcifications. 3.  Complete resolution of all accessible stone fragments larger than one-third mm following laser lithotripsy and basket extraction. 4.  Successful replacement of bilateral ureteral stents, proximal end in renal pelvis, distal end in urinary bladder.  INDICATIONS:  The patient is a pleasant 49 year old man with longstanding history of recurrent urolithiasis.  He was found on workup for colicky flank pain and fevers to have bilateral ureteral stones several weeks ago.  He has significant bacteriuria at  that time as well.  This is highly concerning for impending urosepsis.  He underwent stenting bilaterally  at the time as a temporizing measure.  He was placed on intravenous bridged to oral antibiotics.  His cultures remain negative.  He has cleared  his infectious parameters and now presents for definitive stone management today for bilateral ureteroscopy with goal of stone free.  Informed consent was obtained and placed in medical record.  DESCRIPTION OF PROCEDURE:  The patient being identified, the procedure being bilateral ureteral stone manipulation was confirmed.  Procedure timeout was performed.  Intravenous antibiotics were administered.  General anesthesia  induced.  The patient was  placed into a low lithotomy position.  A sterile field was created, prepped and draped base of the penis, perineum and proximal thighs using iodine.  Cystourethroscopy was performed using 21-French rigid cystoscope with offset lens.  Inspection of the  anterior and posterior urethra was unremarkable.  Inspection of bladder revealed distal end of bilateral ureteral stents in situ.  The distal end of the left stent was grasped, brought to the level of the urethral meatus.  A 0.03 ZIPwire was advanced to  lower pole and the stent was exchanged for an open-ended catheter and a left retrograde pyelogram was obtained.  Left retrograde pyelogram demonstrated a single left ureter, single system left kidney.  At this point, no obvious filling defects or narrowing noted.  The ZIPwire was once again advanced on the left side and set aside as a safety wire.  Similarly, the  distal end of the right stent was grasped, brought to the level of the urethral meatus and a separate 0.03 ZIPwire was advanced to lower pole.  The stent was exchanged for an open-ended catheter and right retrograde pyelogram was obtained.  Right retrograde pyelogram demonstrated a single right ureter, single system right kidney.  There was a questionable filling defect in the midureter consistent with known stone.  A ZIPwire was once again advanced as a safety wire on the right side.  An  8-French feeding tube was placed in the urinary bladder for pressure release.  Next, semirigid ureteroscopy performed of the distal four-fifths of the left ureter alongside a separate sensor working wire.  There was a dominant mostly distal ureteral  stone that was fairly free floating.  This was retrograde positioned to the level of the proximal ureter towards the area of the  kidney purposely with the semirigid scope.  Similarly, semirigid ureteroscopy was performed of the distal orifice of the  right ureter alongside a separate  sensor working wire.  In the most proximal reaches of the ureteroscope, a single ureteral stone was noted, and this appeared much too large for simple basketing.  As such, the semirigid scope was exchanged for a 12/14  medium length ureteral access sheath to the level of the proximal ureter to a level just below the right-sided stone.  Using flexible digital ureteroscopy, the stone in question was much better visualized.  Holmium laser energy was applied 70 setting of  0.2 joules and 20 Hz, and approximately 50% of stone volume was dusted, the remaining 80% fragmented. These fragments were grasped in an Escape basket, removed and set aside for composition analysis.  Inspection of the kidney revealed multifocal  papillary tip calcifications, mostly in mid and lower pole.  These were fortunately all amenable to simple basketing with the Escape basket and removed such that all accessible stone fragments larger than one-third mm were removed on the right side.  The  access sheath was removed under continuous vision.  No mucosal abnormalities were found.  Next, the access sheath was placed over the left sensor working wire to the level of the proximal left ureter and the proximal left ureter was inspected.  The  sheath was placed again directly beneath the level of the prior ureteral stone.  This was also too large for simple basketing.  Holmium was energy applied to the stone with similar settings using a combination of dusting and fragmenting technique and  fragments were then grasped and removed and set aside.  The left kidney was inspected.  Again, there were multifocal papillary tip calcifications, which were all amenable to simple basketing.  Following this, complete resolution of all accessible stone  fragments larger than one-third mm on the left side, the access sheath was removed out of the left ureter using continuous ureteroscopic vision and no mucosal abnormalities were found.  Given the bilateral  procedure, it was felt that brief interval  stenting with a tethered stent would be warranted.  As such, 5 x 26 Polaris-type stents were placed remaining safety wires bilaterally using fluoroscopic guidance.  Good proximal and distal planes were noted.  Tethers were left in place and fashioned to  the dorsum of the penis.  The procedure was terminated.  The patient tolerated the procedure well.  No immediate perioperative complications.  The patient was taken to postanesthesia care in stable condition.  Plan for discharge home.  VN/NUANCE  D:08/18/2019 T:08/18/2019 JOB:[id number]

## 2019-08-18 NOTE — Anesthesia Preprocedure Evaluation (Signed)
Anesthesia Evaluation  Patient identified by MRN, date of birth, ID band Patient awake    Reviewed: Allergy & Precautions, NPO status , Patient's Chart, lab work & pertinent test results  Airway Mallampati: II  TM Distance: >3 FB     Dental   Pulmonary neg pulmonary ROS,    breath sounds clear to auscultation       Cardiovascular negative cardio ROS   Rhythm:Regular Rate:Normal     Neuro/Psych Anxiety Depression    GI/Hepatic negative GI ROS, Neg liver ROS,   Endo/Other    Renal/GU Renal disease     Musculoskeletal   Abdominal   Peds  Hematology   Anesthesia Other Findings   Reproductive/Obstetrics                             Anesthesia Physical Anesthesia Plan  ASA: II  Anesthesia Plan: General   Post-op Pain Management:    Induction: Intravenous  PONV Risk Score and Plan: 2 and Ondansetron, Dexamethasone and Midazolam  Airway Management Planned: LMA  Additional Equipment:   Intra-op Plan:   Post-operative Plan: Extubation in OR  Informed Consent: I have reviewed the patients History and Physical, chart, labs and discussed the procedure including the risks, benefits and alternatives for the proposed anesthesia with the patient or authorized representative who has indicated his/her understanding and acceptance.     Dental advisory given  Plan Discussed with: CRNA and Anesthesiologist  Anesthesia Plan Comments:         Anesthesia Quick Evaluation

## 2019-08-18 NOTE — Anesthesia Postprocedure Evaluation (Signed)
Anesthesia Post Note  Patient: Derrick Barr  Procedure(s) Performed: CYSTOSCOPY WITH RETROGRADE PYELOGRAM, URETEROSCOPY AND STENT PLACEMENT (Bilateral Ureter) HOLMIUM LASER APPLICATION (Bilateral Ureter)     Patient location during evaluation: PACU Anesthesia Type: General Level of consciousness: awake Pain management: pain level controlled Vital Signs Assessment: post-procedure vital signs reviewed and stable Respiratory status: spontaneous breathing Cardiovascular status: stable Postop Assessment: no apparent nausea or vomiting Anesthetic complications: no   No complications documented.  Last Vitals:  Vitals:   08/18/19 1400 08/18/19 1415  BP: (!) 136/98 (!) 137/93  Pulse: 98 94  Resp: 15 20  Temp:    SpO2: 93% 93%    Last Pain:  Vitals:   08/18/19 1420  TempSrc:   PainSc: 0-No pain                 Garnet Overfield

## 2019-08-23 ENCOUNTER — Encounter (HOSPITAL_BASED_OUTPATIENT_CLINIC_OR_DEPARTMENT_OTHER): Payer: Self-pay | Admitting: Urology

## 2019-08-24 ENCOUNTER — Ambulatory Visit (INDEPENDENT_AMBULATORY_CARE_PROVIDER_SITE_OTHER): Payer: 59

## 2019-08-24 ENCOUNTER — Ambulatory Visit (INDEPENDENT_AMBULATORY_CARE_PROVIDER_SITE_OTHER): Payer: 59 | Admitting: Orthopaedic Surgery

## 2019-08-24 ENCOUNTER — Encounter: Payer: Self-pay | Admitting: Orthopaedic Surgery

## 2019-08-24 VITALS — Ht 70.5 in | Wt 235.0 lb

## 2019-08-24 DIAGNOSIS — Z96642 Presence of left artificial hip joint: Secondary | ICD-10-CM

## 2019-08-24 NOTE — Progress Notes (Signed)
Post-Op Visit Note   Patient: Derrick Barr           Date of Birth: 01-Sep-1970           MRN: [id number] Visit Date: 08/24/2019 PCP: Benita Stabile, MD   Assessment & Plan:  Chief Complaint:  Chief Complaint  Patient presents with  . Left Hip - Follow-up    Left THA 06/28/2019   Visit Diagnoses:  1. Status post total replacement of left hip     Plan: Patient is a pleasant 49 year old gentleman who comes in today 8 weeks out left total hip replacement 06/28/2019.  He has been doing very well.  No complaints.  Examination of his left hip reveals full range of motion and strength.  Calf soft nontender.  He is neurovascular intact distally.  Today, dental prophylaxis was reinforced.  We will allow him to return to work full duty.  Follow-up with [place] in 4 weeks time for repeat evaluation.  Call with concerns or questions.  Follow-Up Instructions: Return in about 4 weeks (around 09/21/2019).   Orders:  Orders Placed This Encounter  Procedures  . XR Pelvis 1-2 Views   No orders of the defined types were placed in this encounter.   Imaging: No results found.  PMFS History: Patient Active Problem List   Diagnosis Date Noted  . Ureteral stone with hydronephrosis 07/31/2019  . DJD (degenerative joint disease) 06/28/2019  . Status post total replacement of left hip 06/28/2019  . Unilateral primary osteoarthritis, left hip 06/01/2019  . Acute idiopathic gout of left knee 03/10/2019  . Depression (emotion) 01/01/2015   Past Medical History:  Diagnosis Date  . Anxiety   . Chronic gout followed by pcp   08-11-2019 per pt last episode left knee 06/ 2021  . Depression   . Dysuria   . Hematuria   . History of kidney stones   . OA (osteoarthritis)    knees, hips  . Renal calculus, bilateral   . Urgency of urination     History reviewed. No pertinent family history.  Past Surgical History:  Procedure Laterality Date  . CYSTOSCOPY WITH RETROGRADE PYELOGRAM, URETEROSCOPY AND  STENT PLACEMENT Bilateral 07/31/2019   Procedure: CYSTOSCOPY WITH RETROGRADE PYELOGRAM, URETEROSCOPY AND STENT PLACEMENT;  Surgeon: Sebastian Ache, MD;  Location: [facility];  Service: Urology;  Laterality: Bilateral;  . CYSTOSCOPY WITH RETROGRADE PYELOGRAM, URETEROSCOPY AND STENT PLACEMENT Bilateral 08/18/2019   Procedure: CYSTOSCOPY WITH RETROGRADE PYELOGRAM, URETEROSCOPY AND STENT PLACEMENT;  Surgeon: Sebastian Ache, MD;  Location: [facility];  Service: Urology;  Laterality: Bilateral;  75 MINS  . EYE SURGERY Left age 62   BB 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  . HOLMIUM LASER APPLICATION Bilateral 08/18/2019   Procedure: HOLMIUM LASER APPLICATION;  Surgeon: Sebastian Ache, MD;  Location: [facility];  Service: Urology;  Laterality: Bilateral;  . TOTAL HIP ARTHROPLASTY Left 06/28/2019   Procedure: LEFT TOTAL HIP ARTHROPLASTY ANTERIOR APPROACH;  Surgeon: Tarry Kos, MD;  Location: [facility];  Service: Orthopedics;  Laterality: Left;  . WISDOM TOOTH EXTRACTION     Social History   Occupational History  . Not on file  Tobacco Use  . Smoking status: Never Smoker  . Smokeless tobacco: Never Used  Vaping Use  . Vaping Use: Former  . Quit date: 08/10/2017  Substance and Sexual Activity  . Alcohol use: No    Alcohol/week: 0.0 standard drinks  . Drug use: Never  . Sexual activity: Yes

## 2019-10-05 ENCOUNTER — Ambulatory Visit (INDEPENDENT_AMBULATORY_CARE_PROVIDER_SITE_OTHER): Payer: 59 | Admitting: Physician Assistant

## 2019-10-05 ENCOUNTER — Encounter: Payer: Self-pay | Admitting: Orthopaedic Surgery

## 2019-10-05 DIAGNOSIS — Z96642 Presence of left artificial hip joint: Secondary | ICD-10-CM

## 2019-10-05 NOTE — Progress Notes (Signed)
   Post-Op Visit Note   Patient: Derrick Barr           Date of Birth: [birth year]           MRN: [id number] Visit Date: 10/05/2019 PCP: Benita Stabile, MD   Assessment & Plan:  Chief Complaint:  Chief Complaint  Patient presents with  . Left Hip - Pain   Visit Diagnoses:  1. S/P hip replacement, left     Plan: Patient is a pleasant 49 year old gentleman who comes in today 3 months out left anterior hip replacement 06/28/2019.  He has been doing very well.  He has no complaints of pain.  Examination of his left hip reveals full range of motion strength.  He is neurovascular intact distally.  At this point, he will continue to advance with activity as tolerated.  Dental prophylaxis reinforced.  Follow-up with [place] in 9 months when he is 1 year out from surgery with AP pelvis x-rays.  Follow-Up Instructions: Return in about 9 months (around 07/04/2020).   Orders:  No orders of the defined types were placed in this encounter.  No orders of the defined types were placed in this encounter.   Imaging: No new imaging  PMFS History: Patient Active Problem List   Diagnosis Date Noted  . Ureteral stone with hydronephrosis 07/31/2019  . DJD (degenerative joint disease) 06/28/2019  . Status post total replacement of left hip 06/28/2019  . Unilateral primary osteoarthritis, left hip 06/01/2019  . Acute idiopathic gout of left knee 03/10/2019  . Depression (emotion) 01/01/2015   Past Medical History:  Diagnosis Date  . Anxiety   . Chronic gout followed by pcp   08-11-2019 per pt last episode left knee 06/ 2021  . Depression   . Dysuria   . Hematuria   . History of kidney stones   . OA (osteoarthritis)    knees, hips  . Renal calculus, bilateral   . Urgency of urination     History reviewed. No pertinent family history.  Past Surgical History:  Procedure Laterality Date  . CYSTOSCOPY WITH RETROGRADE PYELOGRAM, URETEROSCOPY AND STENT PLACEMENT Bilateral 07/31/2019   Procedure:  CYSTOSCOPY WITH RETROGRADE PYELOGRAM, URETEROSCOPY AND STENT PLACEMENT;  Surgeon: Sebastian Ache, MD;  Location: [facility];  Service: Urology;  Laterality: Bilateral;  . CYSTOSCOPY WITH RETROGRADE PYELOGRAM, URETEROSCOPY AND STENT PLACEMENT Bilateral 08/18/2019   Procedure: CYSTOSCOPY WITH RETROGRADE PYELOGRAM, URETEROSCOPY AND STENT PLACEMENT;  Surgeon: Sebastian Ache, MD;  Location[facility];  Service: Urology;  Laterality: Bilateral;  75 MINS  . EYE SURGERY Left age 89   BB 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  . HOLMIUM LASER APPLICATION Bilateral 08/18/2019   Procedure: HOLMIUM LASER APPLICATION;  Surgeon: Sebastian Ache, MD;  Location: [facility];  Service: Urology;  Laterality: Bilateral;  . TOTAL HIP ARTHROPLASTY Left 06/28/2019   Procedure: LEFT TOTAL HIP ARTHROPLASTY ANTERIOR APPROACH;  Surgeon: Tarry Kos, MD;  Location: [facility];  Service: Orthopedics;  Laterality: Left;  . WISDOM TOOTH EXTRACTION     Social History   Occupational History  . Not on file  Tobacco Use  . Smoking status: Never Smoker  . Smokeless tobacco: Never Used  Vaping Use  . Vaping Use: Former  . Quit date: 08/10/2017  Substance and Sexual Activity  . Alcohol use: No    Alcohol/week: 0.0 standard drinks  . Drug use: Never  . Sexual activity: Yes

## 2019-10-30 ENCOUNTER — Other Ambulatory Visit: Payer: Self-pay | Admitting: Orthopaedic Surgery

## 2021-02-19 ENCOUNTER — Other Ambulatory Visit: Payer: Self-pay

## 2021-02-19 ENCOUNTER — Inpatient Hospital Stay (HOSPITAL_COMMUNITY)
Admission: EM | Admit: 2021-02-19 | Discharge: 2021-02-22 | DRG: 287 | Disposition: A | Discharge status: Home / Self Care | Payer: PRIVATE HEALTH INSURANCE | Attending: Internal Medicine | Admitting: Internal Medicine

## 2021-02-19 ENCOUNTER — Encounter (HOSPITAL_COMMUNITY): Payer: Self-pay

## 2021-02-19 ENCOUNTER — Inpatient Hospital Stay (HOSPITAL_COMMUNITY): Payer: PRIVATE HEALTH INSURANCE

## 2021-02-19 DIAGNOSIS — Z7984 Long term (current) use of oral hypoglycemic drugs: Secondary | ICD-10-CM | POA: Diagnosis not present

## 2021-02-19 DIAGNOSIS — M1A9XX0 Chronic gout, unspecified, without tophus (tophi): Secondary | ICD-10-CM | POA: Diagnosis present

## 2021-02-19 DIAGNOSIS — R079 Chest pain, unspecified: Secondary | ICD-10-CM | POA: Diagnosis present

## 2021-02-19 DIAGNOSIS — R739 Hyperglycemia, unspecified: Secondary | ICD-10-CM

## 2021-02-19 DIAGNOSIS — R0602 Shortness of breath: Secondary | ICD-10-CM

## 2021-02-19 DIAGNOSIS — I472 Ventricular tachycardia, unspecified: Principal | ICD-10-CM | POA: Diagnosis present

## 2021-02-19 DIAGNOSIS — Z96642 Presence of left artificial hip joint: Secondary | ICD-10-CM | POA: Diagnosis present

## 2021-02-19 DIAGNOSIS — E669 Obesity, unspecified: Secondary | ICD-10-CM | POA: Diagnosis present

## 2021-02-19 DIAGNOSIS — Z87442 Personal history of urinary calculi: Secondary | ICD-10-CM

## 2021-02-19 DIAGNOSIS — Z8616 Personal history of COVID-19: Secondary | ICD-10-CM | POA: Diagnosis not present

## 2021-02-19 DIAGNOSIS — Z6835 Body mass index (BMI) 35.0-35.9, adult: Secondary | ICD-10-CM | POA: Diagnosis not present

## 2021-02-19 DIAGNOSIS — Z880 Allergy status to penicillin: Secondary | ICD-10-CM | POA: Diagnosis not present

## 2021-02-19 DIAGNOSIS — Z79899 Other long term (current) drug therapy: Secondary | ICD-10-CM

## 2021-02-19 DIAGNOSIS — I4729 Other ventricular tachycardia: Secondary | ICD-10-CM

## 2021-02-19 DIAGNOSIS — E1165 Type 2 diabetes mellitus with hyperglycemia: Secondary | ICD-10-CM | POA: Diagnosis present

## 2021-02-19 DIAGNOSIS — Z8249 Family history of ischemic heart disease and other diseases of the circulatory system: Secondary | ICD-10-CM | POA: Diagnosis not present

## 2021-02-19 DIAGNOSIS — R42 Dizziness and giddiness: Secondary | ICD-10-CM

## 2021-02-19 HISTORY — DX: Prediabetes: R73.03

## 2021-02-19 LAB — CBC WITH DIFFERENTIAL/PLATELET
Abs Immature Granulocytes: 0.05 10*3/uL (ref 0.00–0.07)
Basophils Absolute: 0.1 10*3/uL (ref 0.0–0.1)
Basophils Relative: 1 %
Eosinophils Absolute: 0.3 10*3/uL (ref 0.0–0.5)
Eosinophils Relative: 3 %
HCT: 43.8 % (ref 39.0–52.0)
Hemoglobin: 15.2 g/dL (ref 13.0–17.0)
Immature Granulocytes: 0 %
Lymphocytes Relative: 29 %
Lymphs Abs: 3.4 10*3/uL (ref 0.7–4.0)
MCH: 31.5 pg (ref 26.0–34.0)
MCHC: 34.7 g/dL (ref 30.0–36.0)
MCV: 90.7 fL (ref 80.0–100.0)
Monocytes Absolute: 0.8 10*3/uL (ref 0.1–1.0)
Monocytes Relative: 7 %
Neutro Abs: 7.2 10*3/uL (ref 1.7–7.7)
Neutrophils Relative %: 60 %
Platelets: 248 10*3/uL (ref 150–400)
RBC: 4.83 MIL/uL (ref 4.22–5.81)
RDW: 12.5 % (ref 11.5–15.5)
WBC: 11.9 10*3/uL — ABNORMAL HIGH (ref 4.0–10.5)
nRBC: 0 % (ref 0.0–0.2)

## 2021-02-19 LAB — BASIC METABOLIC PANEL
Anion gap: 9 (ref 5–15)
BUN: 10 mg/dL (ref 6–20)
CO2: 24 mmol/L (ref 22–32)
Calcium: 9.2 mg/dL (ref 8.9–10.3)
Chloride: 100 mmol/L (ref 98–111)
Creatinine, Ser: 1.07 mg/dL (ref 0.61–1.24)
GFR, Estimated: >60 mL/min (ref >60)
Glucose, Bld: 364 mg/dL — ABNORMAL HIGH (ref 70–99)
Potassium: 3.7 mmol/L (ref 3.5–5.1)
Sodium: 133 mmol/L — ABNORMAL LOW (ref 135–145)

## 2021-02-19 LAB — MAGNESIUM: Magnesium: 1.8 mg/dL (ref 1.7–2.4)

## 2021-02-19 LAB — RESP PANEL BY RT-PCR (FLU A&B, COVID) ARPGX2
Influenza A by PCR: NEGATIVE
Influenza B by PCR: NEGATIVE
SARS Coronavirus 2 by RT PCR: NEGATIVE

## 2021-02-19 LAB — TROPONIN I (HIGH SENSITIVITY): Troponin I (High Sensitivity): 6 ng/L (ref <18)

## 2021-02-19 MED ORDER — AMIODARONE HCL IN DEXTROSE 360-4.14 MG/200ML-% IV SOLN
30.0000 mg/h | INTRAVENOUS | Status: DC
  Administered 2021-02-20 (×2): 30 mg/h via INTRAVENOUS
  Filled 2021-02-19 (×3): qty 200

## 2021-02-19 MED ORDER — AMIODARONE LOAD VIA INFUSION
150.0000 mg | Freq: Once | INTRAVENOUS | Status: AC
  Administered 2021-02-19: 150 mg via INTRAVENOUS
  Filled 2021-02-19: qty 83.34

## 2021-02-19 MED ORDER — AMIODARONE HCL IN DEXTROSE 360-4.14 MG/200ML-% IV SOLN
INTRAVENOUS | Status: AC
  Filled 2021-02-19: qty 200

## 2021-02-19 MED ORDER — AMIODARONE HCL IN DEXTROSE 360-4.14 MG/200ML-% IV SOLN
60.0000 mg/h | INTRAVENOUS | Status: DC
  Administered 2021-02-19 – 2021-02-20 (×2): 60 mg/h via INTRAVENOUS

## 2021-02-19 NOTE — ED Provider Notes (Signed)
[facility] EMERGENCY DEPARTMENT Provider Note   CSN: [id number] Arrival date & time: 02/19/21  2049     History  Chief Complaint  Patient presents with   Shortness of Breath    Derrick Barr is a 51 y.o. male.  He has no splint past medical history.  He has been complaining of on and off dizziness and shortness of breath has been going on today.  He checked his heart monitor on his watch and said his heart rate was elevated in the 140s.  He said he is felt this way for brief periods of time in the past but never got it checked out.  Denies any tobacco or drugs.  No syncope.  The history is provided by the patient.  Shortness of Breath Severity:  Moderate Onset quality:  Gradual Duration:  1 day Timing:  Intermittent Progression:  Unchanged Chronicity:  Recurrent Relieved by:  Nothing Worsened by:  Nothing Ineffective treatments:  None tried Associated symptoms: no abdominal pain, no chest pain, no cough, no fever, no headaches, no neck pain, no rash, no sore throat, no syncope and no vomiting   Risk factors: no recent alcohol use and no tobacco use       Home Medications Prior to Admission medications   Medication Sig Start Date End Date Taking? Authorizing Provider  colchicine 0.6 MG tablet Take 1 tablet (0.6 mg total) by mouth 2 (two) times daily as needed. 07/06/19   Leandrew Koyanagi, MD  diclofenac Sodium (VOLTAREN) 1 % GEL Apply 2 g topically 2 (two) times daily as needed (joint pain).    [provider]  febuxostat (ULORIC) 40 MG tablet Take 1 tablet (40 mg total) by mouth daily. 07/13/19   Leandrew Koyanagi, MD  ketorolac (TORADOL) 10 MG tablet Take 1 tablet (10 mg total) by mouth every 8 (eight) hours as needed for moderate pain. Or stent discomfort post-operatively. 08/18/19   Alexis Frock, MD  methocarbamol (ROBAXIN) 750 MG tablet Take 1 tablet (750 mg total) by mouth 2 (two) times daily as needed for muscle spasms. 06/27/19   Leandrew Koyanagi, MD  senna-docusate  (SENOKOT S) 8.6-50 MG tablet Take 1-2 tablets by mouth at bedtime as needed. 06/27/19   Leandrew Koyanagi, MD  sertraline (ZOLOFT) 50 MG tablet Take 50 mg by mouth daily.    [provider]  traZODone (DESYREL) 50 MG tablet Take 50 mg by mouth at bedtime as needed for sleep. 06/10/19   [provider]      Allergies    Augmentin [amoxicillin-pot clavulanate]    Review of Systems   Review of Systems  Constitutional:  Negative for fever.  HENT:  Negative for sore throat.   Eyes:  Negative for visual disturbance.  Respiratory:  Positive for shortness of breath. Negative for cough.   Cardiovascular:  Negative for chest pain and syncope.  Gastrointestinal:  Negative for abdominal pain and vomiting.  Genitourinary:  Negative for dysuria.  Musculoskeletal:  Negative for neck pain.  Skin:  Negative for rash.  Neurological:  Positive for dizziness and light-headedness. Negative for headaches.   Physical Exam Updated Vital Signs BP 121/60 (BP Location: Right Arm)    Pulse 85    Temp (!) 97.5 F (36.4 C) (Oral)    Resp 16    Ht 5\' 11"  (1.803 m)    Wt 114.3 kg    SpO2 95%    BMI 35.15 kg/m  Physical Exam Vitals and nursing note reviewed.  Constitutional:      General: He is not in acute distress.    Appearance: He is well-developed.  HENT:     Head: Normocephalic and atraumatic.  Eyes:     Conjunctiva/sclera: Conjunctivae normal.  Cardiovascular:     Rate and Rhythm: Normal rate and regular rhythm.     Heart sounds: No murmur heard. Pulmonary:     Effort: Pulmonary effort is normal. No respiratory distress.     Breath sounds: Normal breath sounds.  Abdominal:     Palpations: Abdomen is soft.     Tenderness: There is no abdominal tenderness.  Musculoskeletal:        General: No swelling. Normal range of motion.     Cervical back: Neck supple.     Right lower leg: No tenderness.     Left lower leg: No tenderness.  Skin:    General: Skin is warm and dry.     Capillary  Refill: Capillary refill takes less than 2 seconds.  Neurological:     General: No focal deficit present.     Mental Status: He is alert.    ED Results / Procedures / Treatments   Labs (all labs ordered are listed, but only abnormal results are displayed) Labs Reviewed  BASIC METABOLIC PANEL - Abnormal; Notable for the following components:      Result Value   Sodium 133 (*)    Glucose, Bld 364 (*)    All other components within normal limits  CBC WITH DIFFERENTIAL/PLATELET - Abnormal; Notable for the following components:   WBC 11.9 (*)    All other components within normal limits  RESP PANEL BY RT-PCR (FLU A&B, COVID) ARPGX2  RAPID URINE DRUG SCREEN, HOSP PERFORMED  MAGNESIUM  TROPONIN I (HIGH SENSITIVITY)  TROPONIN I (HIGH SENSITIVITY)    EKG EKG Interpretation  Date/Time:  Monday February 19 2021 21:25:24 EST Ventricular Rate:  171 PR Interval:  161 QRS Duration: 96 QT Interval:  356 QTC Calculation: 406 R Axis:   85 Text Interpretation: Normal sinus rhythm  with runs of VTach Ventricular tachycardia, unsustained No old tracing to compare Confirmed by Meridee Score [phone]) on 02/19/2021 9:38:00 PM  Radiology DG Chest Port 1 View  Result Date: 02/19/2021 CLINICAL DATA:  Dizziness and shortness of breath. EXAM: PORTABLE CHEST 1 VIEW COMPARISON:  Chest x-ray 06/24/2019. FINDINGS: The heart size and mediastinal contours are within normal limits. Both lungs are clear. The visualized skeletal structures are unremarkable. IMPRESSION: No active disease. Electronically Signed   By: Darliss Cheney M.D.   On: 02/19/2021 22:06    Procedures .Critical Care Performed by: Terrilee Files, MD Authorized by: Terrilee Files, MD   Critical care provider statement:    Critical care time (minutes):  45   Critical care time was exclusive of:  Separately billable procedures and treating other patients   Critical care was necessary to treat or prevent imminent or life-threatening  deterioration of the following conditions:  Cardiac failure   Critical care was time spent personally by me on the following activities:  Development of treatment plan with patient or surrogate, discussions with consultants, evaluation of patient's response to treatment, examination of patient, obtaining history from patient or surrogate, ordering and performing treatments and interventions, ordering and review of laboratory studies, ordering and review of radiographic studies, pulse oximetry, re-evaluation of patient's condition and review of old charts   I assumed direction of critical care for this patient from another provider in my specialty: no  Medications Ordered in ED Medications  amiodarone (NEXTERONE) 1.8 mg/mL load via infusion 150 mg (150 mg Intravenous Bolus from Bag 02/19/21 2153)    Followed by  amiodarone (NEXTERONE PREMIX) 360-4.14 MG/200ML-% (1.8 mg/mL) IV infusion (0 mg/hr Intravenous Stopped 02/20/21 0352)    Followed by  amiodarone (NEXTERONE PREMIX) 360-4.14 MG/200ML-% (1.8 mg/mL) IV infusion (30 mg/hr Intravenous Rate/Dose Change 02/20/21 0352)    ED Course/ Medical Decision Making/ A&P Clinical Course as of 02/20/21 0904  Mon Feb 19, 2021  2138 Having nonsustained V. tach.  Hemodynamically stable.  Amiodarone bolus and infusion ordered.  Pacer pads placed on patient [MB]  2150 Discussed with Dr. Denyce Robert cardiology.  He agrees with current management and asked the patient to be admitted to the cardiology service under Dr. Phineas Inches at [place] [facility]. [MB]  2220 Chest x-ray interpreted by me as no acute pulmonary disease.  Awaiting radiology reading. [MB]  2226 Patient having no runs of V. tach and was active.  After amiodarone started. [MB]    Clinical Course User Index [MB] Hayden Rasmussen, MD                           Medical Decision Making Amount and/or Complexity of Data Reviewed Labs: ordered. Radiology: ordered.  Risk Prescription  drug management. Decision regarding hospitalization.  This patient complains of feeling dizzy and short of breath; this involves an extensive number of treatment Options and is a complaint that carries with it a high risk of complications and Morbidity. The differential includes arrhythmia, dehydration, ACS, pneumonia, PE metabolic derangement  I ordered, reviewed and interpreted labs, which included CBC with mildly elevated white count normal hemoglobin, chemistries with low sodium and elevated glucose consistent with some pseudohyponatremia, probable new onset diabetes, COVID and flu negative, troponins flat I ordered medication IV amiodarone with improvement in patient's ectopy I ordered imaging studies which included chest x-ray and I independently    visualized and interpreted imaging which showed no acute findings Previous records obtained and reviewed in epic no recent admissions I consulted Dr. Elisabeth Cara cardiology and discussed lab and imaging findings  Critical Interventions: Initiation of IV amiodarone for nonsustained V. tach  After the interventions stated above, I reevaluated the patient and found patient to be symptomatically improved.  He is having less runs of V. tach and infrequent ectopy.  He will need to be admitted at the hospital for further evaluation including likely cardiac cath.  Patient in agreement with plan for admission.          Final Clinical Impression(s) / ED Diagnoses Final diagnoses:  None    Rx / DC Orders ED Discharge Orders     None         Hayden Rasmussen, MD 02/20/21 [phone]

## 2021-02-19 NOTE — ED Triage Notes (Signed)
Pt arrived via POV w c/o SOB

## 2021-02-20 ENCOUNTER — Other Ambulatory Visit (HOSPITAL_COMMUNITY): Payer: Self-pay | Admitting: *Deleted

## 2021-02-20 ENCOUNTER — Inpatient Hospital Stay (HOSPITAL_BASED_OUTPATIENT_CLINIC_OR_DEPARTMENT_OTHER): Payer: PRIVATE HEALTH INSURANCE

## 2021-02-20 ENCOUNTER — Encounter (HOSPITAL_COMMUNITY): Payer: Self-pay | Admitting: Internal Medicine

## 2021-02-20 ENCOUNTER — Encounter (HOSPITAL_COMMUNITY)
Admission: EM | Disposition: A | Discharge status: Home / Self Care | Payer: Self-pay | Source: Home / Self Care | Attending: Internal Medicine

## 2021-02-20 DIAGNOSIS — Z96642 Presence of left artificial hip joint: Secondary | ICD-10-CM | POA: Diagnosis not present

## 2021-02-20 DIAGNOSIS — Z8616 Personal history of COVID-19: Secondary | ICD-10-CM | POA: Diagnosis not present

## 2021-02-20 DIAGNOSIS — Z6835 Body mass index (BMI) 35.0-35.9, adult: Secondary | ICD-10-CM | POA: Diagnosis not present

## 2021-02-20 DIAGNOSIS — I4729 Other ventricular tachycardia: Secondary | ICD-10-CM

## 2021-02-20 DIAGNOSIS — E1165 Type 2 diabetes mellitus with hyperglycemia: Secondary | ICD-10-CM | POA: Diagnosis not present

## 2021-02-20 DIAGNOSIS — R0602 Shortness of breath: Secondary | ICD-10-CM | POA: Diagnosis present

## 2021-02-20 DIAGNOSIS — I472 Ventricular tachycardia, unspecified: Secondary | ICD-10-CM | POA: Diagnosis not present

## 2021-02-20 DIAGNOSIS — R9431 Abnormal electrocardiogram [ECG] [EKG]: Secondary | ICD-10-CM

## 2021-02-20 DIAGNOSIS — Z8249 Family history of ischemic heart disease and other diseases of the circulatory system: Secondary | ICD-10-CM | POA: Diagnosis not present

## 2021-02-20 DIAGNOSIS — I2510 Atherosclerotic heart disease of native coronary artery without angina pectoris: Secondary | ICD-10-CM

## 2021-02-20 DIAGNOSIS — E669 Obesity, unspecified: Secondary | ICD-10-CM | POA: Diagnosis not present

## 2021-02-20 DIAGNOSIS — R079 Chest pain, unspecified: Secondary | ICD-10-CM | POA: Diagnosis not present

## 2021-02-20 DIAGNOSIS — Z87442 Personal history of urinary calculi: Secondary | ICD-10-CM | POA: Diagnosis not present

## 2021-02-20 DIAGNOSIS — M1A9XX0 Chronic gout, unspecified, without tophus (tophi): Secondary | ICD-10-CM | POA: Diagnosis not present

## 2021-02-20 DIAGNOSIS — Z79899 Other long term (current) drug therapy: Secondary | ICD-10-CM | POA: Diagnosis not present

## 2021-02-20 DIAGNOSIS — Z880 Allergy status to penicillin: Secondary | ICD-10-CM | POA: Diagnosis not present

## 2021-02-20 DIAGNOSIS — Z7984 Long term (current) use of oral hypoglycemic drugs: Secondary | ICD-10-CM | POA: Diagnosis not present

## 2021-02-20 HISTORY — PX: LEFT HEART CATH AND CORONARY ANGIOGRAPHY: CATH118249

## 2021-02-20 LAB — ECHOCARDIOGRAM COMPLETE
AR max vel: 3.51 cm2
AV Area VTI: 4.25 cm2
AV Area mean vel: 4.27 cm2
AV Mean grad: 2.2 mmHg
AV Peak grad: 5 mmHg
Ao pk vel: 1.12 m/s
Area-P 1/2: 3.91 cm2
Height: 71 in
S' Lateral: 3.5 cm
Weight: 4032 oz

## 2021-02-20 LAB — TROPONIN I (HIGH SENSITIVITY): Troponin I (High Sensitivity): 5 ng/L (ref <18)

## 2021-02-20 LAB — RAPID URINE DRUG SCREEN, HOSP PERFORMED
Amphetamines: NOT DETECTED
Barbiturates: NOT DETECTED
Benzodiazepines: NOT DETECTED
Cocaine: NOT DETECTED
Opiates: NOT DETECTED
Tetrahydrocannabinol: NOT DETECTED

## 2021-02-20 LAB — GLUCOSE, CAPILLARY
Glucose-Capillary: 136 mg/dL — ABNORMAL HIGH (ref 70–99)
Glucose-Capillary: 221 mg/dL — ABNORMAL HIGH (ref 70–99)

## 2021-02-20 LAB — CBG MONITORING, ED
Glucose-Capillary: 210 mg/dL — ABNORMAL HIGH (ref 70–99)
Glucose-Capillary: 195 mg/dL — ABNORMAL HIGH (ref 70–99)

## 2021-02-20 LAB — HEMOGLOBIN A1C
Hgb A1c MFr Bld: 9.9 % — ABNORMAL HIGH (ref 4.8–5.6)
Mean Plasma Glucose: 237.43 mg/dL

## 2021-02-20 LAB — HIV ANTIBODY (ROUTINE TESTING W REFLEX): HIV Screen 4th Generation wRfx: NONREACTIVE

## 2021-02-20 SURGERY — LEFT HEART CATH AND CORONARY ANGIOGRAPHY
Anesthesia: LOCAL

## 2021-02-20 MED ORDER — MIDAZOLAM HCL 2 MG/2ML IJ SOLN
INTRAMUSCULAR | Status: AC
  Filled 2021-02-20: qty 2

## 2021-02-20 MED ORDER — HEPARIN (PORCINE) IN NACL 1000-0.9 UT/500ML-% IV SOLN
INTRAVENOUS | Status: AC
  Filled 2021-02-20: qty 1000

## 2021-02-20 MED ORDER — HYDRALAZINE HCL 20 MG/ML IJ SOLN: 10.0000 mg | INTRAMUSCULAR | Status: AC | PRN

## 2021-02-20 MED ORDER — HEPARIN (PORCINE) IN NACL 1000-0.9 UT/500ML-% IV SOLN
INTRAVENOUS | Status: DC | PRN
  Administered 2021-02-20 (×2): 500 mL

## 2021-02-20 MED ORDER — SODIUM CHLORIDE 0.9 % IV SOLN: 250.0000 mL | INTRAVENOUS | Status: DC | PRN

## 2021-02-20 MED ORDER — SODIUM CHLORIDE 0.9 % WEIGHT BASED INFUSION
3.0000 mL/kg/h | INTRAVENOUS | Status: DC
  Administered 2021-02-20: 3 mL/kg/h via INTRAVENOUS

## 2021-02-20 MED ORDER — SODIUM CHLORIDE 0.9 % WEIGHT BASED INFUSION
1.0000 mL/kg/h | INTRAVENOUS | Status: DC
  Administered 2021-02-20: 1 mL/kg/h via INTRAVENOUS

## 2021-02-20 MED ORDER — ASPIRIN 81 MG PO CHEW
324.0000 mg | CHEWABLE_TABLET | ORAL | Status: AC
  Administered 2021-02-20: 324 mg via ORAL
  Filled 2021-02-20: qty 4

## 2021-02-20 MED ORDER — LIDOCAINE HCL (PF) 1 % IJ SOLN
INTRAMUSCULAR | Status: AC
  Filled 2021-02-20: qty 30

## 2021-02-20 MED ORDER — HEPARIN SODIUM (PORCINE) 1000 UNIT/ML IJ SOLN
INTRAMUSCULAR | Status: DC | PRN
  Administered 2021-02-20: 5000 [IU] via INTRAVENOUS

## 2021-02-20 MED ORDER — POTASSIUM CHLORIDE CRYS ER 20 MEQ PO TBCR
40.0000 meq | EXTENDED_RELEASE_TABLET | Freq: Once | ORAL | Status: AC
  Administered 2021-02-20: 40 meq via ORAL
  Filled 2021-02-20: qty 2

## 2021-02-20 MED ORDER — ASPIRIN 81 MG PO CHEW: 81.0000 mg | CHEWABLE_TABLET | ORAL | Status: DC

## 2021-02-20 MED ORDER — INSULIN ASPART 100 UNIT/ML IJ SOLN
0.0000 [IU] | INTRAMUSCULAR | Status: DC
  Administered 2021-02-20: 2 [IU] via SUBCUTANEOUS
  Administered 2021-02-20 – 2021-02-21 (×3): 3 [IU] via SUBCUTANEOUS
  Administered 2021-02-21 (×5): 2 [IU] via SUBCUTANEOUS
  Administered 2021-02-21 – 2021-02-22 (×2): 1 [IU] via SUBCUTANEOUS
  Administered 2021-02-22 (×2): 2 [IU] via SUBCUTANEOUS
  Filled 2021-02-20 (×2): qty 1

## 2021-02-20 MED ORDER — SODIUM CHLORIDE 0.9% FLUSH: 3.0000 mL | INTRAVENOUS | Status: DC | PRN

## 2021-02-20 MED ORDER — SODIUM CHLORIDE 0.9 % WEIGHT BASED INFUSION: 1.0000 mL/kg/h | INTRAVENOUS | Status: DC

## 2021-02-20 MED ORDER — ASPIRIN EC 81 MG PO TBEC
81.0000 mg | DELAYED_RELEASE_TABLET | Freq: Every day | ORAL | Status: DC
  Administered 2021-02-21 – 2021-02-22 (×2): 81 mg via ORAL
  Filled 2021-02-20 (×2): qty 1

## 2021-02-20 MED ORDER — ATORVASTATIN CALCIUM 80 MG PO TABS
80.0000 mg | ORAL_TABLET | Freq: Every day | ORAL | Status: DC
  Administered 2021-02-20 – 2021-02-21 (×2): 80 mg via ORAL
  Filled 2021-02-20: qty 1
  Filled 2021-02-20: qty 2

## 2021-02-20 MED ORDER — ENOXAPARIN SODIUM 120 MG/0.8ML IJ SOSY
110.0000 mg | PREFILLED_SYRINGE | Freq: Once | INTRAMUSCULAR | Status: AC
  Administered 2021-02-20: 110 mg via SUBCUTANEOUS
  Filled 2021-02-20: qty 0.8

## 2021-02-20 MED ORDER — ONDANSETRON HCL 4 MG/2ML IJ SOLN: 4.0000 mg | Freq: Four times a day (QID) | INTRAMUSCULAR | Status: DC | PRN

## 2021-02-20 MED ORDER — ENOXAPARIN SODIUM 120 MG/0.8ML IJ SOSY
110.0000 mg | PREFILLED_SYRINGE | Freq: Two times a day (BID) | INTRAMUSCULAR | Status: DC
  Administered 2021-02-20 – 2021-02-21 (×2): 110 mg via SUBCUTANEOUS
  Filled 2021-02-20 (×3): qty 0.74

## 2021-02-20 MED ORDER — VERAPAMIL HCL 2.5 MG/ML IV SOLN
INTRAVENOUS | Status: AC
  Filled 2021-02-20: qty 2

## 2021-02-20 MED ORDER — MIDAZOLAM HCL 2 MG/2ML IJ SOLN
INTRAMUSCULAR | Status: DC | PRN
  Administered 2021-02-20: 2 mg via INTRAVENOUS

## 2021-02-20 MED ORDER — ACETAMINOPHEN 325 MG PO TABS: 650.0000 mg | ORAL_TABLET | ORAL | Status: DC | PRN

## 2021-02-20 MED ORDER — MAGNESIUM SULFATE 2 GM/50ML IV SOLN
2.0000 g | Freq: Once | INTRAVENOUS | Status: AC
  Administered 2021-02-20: 2 g via INTRAVENOUS
  Filled 2021-02-20: qty 50

## 2021-02-20 MED ORDER — HEPARIN SODIUM (PORCINE) 1000 UNIT/ML IJ SOLN
INTRAMUSCULAR | Status: AC
  Filled 2021-02-20: qty 10

## 2021-02-20 MED ORDER — SODIUM CHLORIDE 0.9 % IV SOLN: INTRAVENOUS | Status: AC

## 2021-02-20 MED ORDER — SODIUM CHLORIDE 0.9 % WEIGHT BASED INFUSION: 3.0000 mL/kg/h | INTRAVENOUS | Status: DC

## 2021-02-20 MED ORDER — NITROGLYCERIN 0.4 MG SL SUBL
0.4000 mg | SUBLINGUAL_TABLET | SUBLINGUAL | Status: DC | PRN
  Administered 2021-02-20 – 2021-02-21 (×2): 0.4 mg via SUBLINGUAL
  Filled 2021-02-20 (×2): qty 1

## 2021-02-20 MED ORDER — ACETAMINOPHEN 325 MG PO TABS
650.0000 mg | ORAL_TABLET | ORAL | Status: DC | PRN
  Administered 2021-02-21: 650 mg via ORAL
  Filled 2021-02-20: qty 2

## 2021-02-20 MED ORDER — IOHEXOL 350 MG/ML SOLN
INTRAVENOUS | Status: DC | PRN
  Administered 2021-02-20: 45 mL

## 2021-02-20 MED ORDER — FENTANYL CITRATE (PF) 100 MCG/2ML IJ SOLN
INTRAMUSCULAR | Status: DC | PRN
  Administered 2021-02-20: 25 ug via INTRAVENOUS

## 2021-02-20 MED ORDER — FENTANYL CITRATE (PF) 100 MCG/2ML IJ SOLN
INTRAMUSCULAR | Status: AC
  Filled 2021-02-20: qty 2

## 2021-02-20 MED ORDER — LABETALOL HCL 5 MG/ML IV SOLN: 10.0000 mg | INTRAVENOUS | Status: AC | PRN

## 2021-02-20 MED ORDER — METOPROLOL SUCCINATE ER 25 MG PO TB24
25.0000 mg | ORAL_TABLET | Freq: Two times a day (BID) | ORAL | Status: DC
  Administered 2021-02-20: 25 mg via ORAL
  Filled 2021-02-20: qty 1

## 2021-02-20 MED ORDER — SODIUM CHLORIDE 0.9% FLUSH
3.0000 mL | Freq: Two times a day (BID) | INTRAVENOUS | Status: DC
  Administered 2021-02-21 – 2021-02-22 (×3): 3 mL via INTRAVENOUS

## 2021-02-20 MED ORDER — VERAPAMIL HCL 2.5 MG/ML IV SOLN: INTRAVENOUS | Status: DC | PRN

## 2021-02-20 MED ORDER — ASPIRIN 300 MG RE SUPP: 300.0000 mg | RECTAL | Status: AC

## 2021-02-20 MED ORDER — LIDOCAINE HCL (PF) 1 % IJ SOLN
INTRAMUSCULAR | Status: DC | PRN
  Administered 2021-02-20: 2 mL

## 2021-02-20 SURGICAL SUPPLY — 10 items
CATH 5FR JL3.5 JR4 ANG PIG MP (CATHETERS) ×1 IMPLANT
DEVICE RAD COMP TR BAND LRG (VASCULAR PRODUCTS) ×1 IMPLANT
GLIDESHEATH SLEND SS 6F .021 (SHEATH) ×1 IMPLANT
GUIDEWIRE INQWIRE 1.5J.035X260 (WIRE) IMPLANT
INQWIRE 1.5J .035X260CM (WIRE) ×2
KIT HEART LEFT (KITS) ×2 IMPLANT
PACK CARDIAC CATHETERIZATION (CUSTOM PROCEDURE TRAY) ×2 IMPLANT
SHEATH PROBE COVER 6X72 (BAG) ×1 IMPLANT
TRANSDUCER W/STOPCOCK (MISCELLANEOUS) ×2 IMPLANT
TUBING CIL FLEX 10 FLL-RA (TUBING) ×2 IMPLANT

## 2021-02-20 NOTE — ED Notes (Signed)
Pt resting comfortably

## 2021-02-20 NOTE — Progress Notes (Signed)
*  PRELIMINARY RESULTS* Echocardiogram 2D Echocardiogram has been performed.  Derrick Barr 02/20/2021, 10:37 AM

## 2021-02-20 NOTE — Progress Notes (Signed)
ANTICOAGULATION CONSULT NOTE - Initial Consult  Pharmacy Consult for enoxaparin Indication: chest pain/ACS and NStemi  Allergies  Allergen Reactions   Augmentin [Amoxicillin-Pot Clavulanate] Hives    Patient Measurements: Height: 5\' 11"  (180.3 cm) Weight: 114.3 kg (252 lb) IBW/kg (Calculated) : 75.3 Heparin Dosing Weight: HEPARIN DW (KG): 100.2   Vital Signs: BP: 129/81 (01/31 1030) Pulse Rate: 72 (01/31 1030)  Labs: Recent Labs    02/19/21 2141  HGB 15.2  HCT 43.8  PLT 248  CREATININE 1.07    Estimated Creatinine Clearance: 106.2 mL/min (by C-G formula based on SCr of 1.07 mg/dL).   Medical History: Past Medical History:  Diagnosis Date   Anxiety    Chronic gout followed by pcp   08-11-2019 per pt last episode left knee 06/ 2021   Depression    Dysuria    Hematuria    History of kidney stones    OA (osteoarthritis)    knees, hips   Prediabetes    Renal calculus, bilateral    Urgency of urination     Medications:  (Not in a hospital admission)  Scheduled:   aspirin  81 mg Oral Pre-Cath   [START ON 02/21/2021] aspirin EC  81 mg Oral Daily   atorvastatin  80 mg Oral q1800   enoxaparin (LOVENOX) injection  110 mg Subcutaneous Q12H   insulin aspart  0-9 Units Subcutaneous Q4H   Infusions:   sodium chloride     amiodarone 30 mg/hr (02/20/21 0950)   PRN: acetaminophen, nitroGLYCERIN, ondansetron (ZOFRAN) IV Anti-infectives (From admission, onward)    None       Assessment: Derrick Barr a 51 y.o. male requires anticoagulation with enoxaparin for the indication of  chest pain/ACS.   Goal of Therapy:  Anti-Xa level 0.6-1 units/ml 4hrs after LMWH dose given Monitor platelets by anticoagulation protocol: Yes   Plan:  Lovenox 110mg  subq q12h    Donna Christen Wanna Gully 02/20/2021,12:26 PM

## 2021-02-20 NOTE — Consult Note (Deleted)
Cardiology Consultation:   Patient ID: Derrick Barr MRN: [id number]; DOB: [birth year]  Admit date: 02/19/2021 Date of Consult: 02/20/2021  PCP:  Celene Squibb, MD   [facility] Cardiologist:  Carlyle Dolly, MD        Patient Profile:   Derrick Barr is a 51 y.o. male with a hx of urolithiasis, gout, who is being seen 02/20/2021 for the evaluation of NSVT at the request of Dr. Melina Copa.  History of Present Illness:   Derrick Barr with history of gout, family hx early CAD, prediabetes on metformin. Yest at work became SOB and dizzy and HR on watch 115. He had several runs NSVT on EKG and tele-8 beats longest. K 3.7, glucose 364, WBC 11.9 otherwise labs unremarkable. Now NSR on IV Amio. Patient says he's had some SOB since Covid19 last summer but yest became worse and HR jumped up with standing. He denies any chest pain, drug use, smoking, ETOH, HTN, HLD.Drinks 16 oz coffee and 3 green teas daily.  His father had MI in 60's, CABG in 14's and still living 69. Sister is a Marine scientist at [employer]. 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   Past Medical History:  Diagnosis Date   Anxiety    Chronic gout followed by pcp   08-11-2019 per pt last episode left knee 06/ 2021   Depression    Dysuria    Hematuria    History of kidney stones    OA (osteoarthritis)    knees, hips   Renal calculus, bilateral    Urgency of urination     Past Surgical History:  Procedure Laterality Date   CYSTOSCOPY WITH RETROGRADE PYELOGRAM, URETEROSCOPY AND STENT PLACEMENT Bilateral 07/31/2019   Procedure: CYSTOSCOPY WITH RETROGRADE PYELOGRAM, URETEROSCOPY AND STENT PLACEMENT;  Surgeon: Alexis Frock, MD;  Location: [facility];  Service: Urology;  Laterality: Bilateral;   CYSTOSCOPY WITH RETROGRADE PYELOGRAM, URETEROSCOPY AND STENT PLACEMENT Bilateral 08/18/2019   Procedure: CYSTOSCOPY WITH RETROGRADE PYELOGRAM, URETEROSCOPY AND STENT PLACEMENT;  Surgeon: Alexis Frock,  MD;  Location: [facility];  Service: Urology;  Laterality: Bilateral;  75 MINS   EYE SURGERY Left age 89   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 Authorizing Provider  allopurinol (ZYLOPRIM) 100 MG tablet Take 1 tablet by mouth daily as needed (gout).   Yes [provider]  diclofenac Sodium (VOLTAREN) 1 % GEL Apply 2 g topically 2 (two) times daily as needed (joint pain).   Yes [provider]  metFORMIN (GLUCOPHAGE-XR) 500 MG 24 hr tablet Take 500 mg by mouth daily. 02/13/21  Yes [provider]  colchicine 0.6 MG tablet Take 1 tablet (0.6 mg total) by mouth 2 (two) times daily as needed. Patient not taking: Reported on 02/20/2021 07/06/19   Leandrew Koyanagi, MD  febuxostat (ULORIC) 40 MG tablet Take 1 tablet (40 mg total) by mouth daily. Patient not taking: Reported on 02/20/2021 07/13/19   Erlinda Hong,  Edwin Cap, MD  ketorolac (TORADOL) 10 MG tablet Take 1 tablet (10 mg total) by mouth every 8 (eight) hours as needed for moderate pain. Or stent discomfort post-operatively. Patient not taking: Reported on 02/20/2021 08/18/19   Sebastian Ache, MD  methocarbamol (ROBAXIN) 750 MG tablet Take 1 tablet (750 mg total) by mouth 2 (two) times daily as needed for muscle spasms. Patient not taking: Reported on 02/20/2021 06/27/19   Tarry Kos, MD  senna-docusate (SENOKOT S) 8.6-50 MG tablet Take 1-2 tablets by mouth at bedtime as needed. Patient not taking: Reported on  02/20/2021 06/27/19   Tarry Kos, MD    Inpatient Medications: Scheduled Meds:  Continuous Infusions:  amiodarone 30 mg/hr (02/20/21 0352)   PRN Meds:   Allergies:    Allergies  Allergen Reactions   Augmentin [Amoxicillin-Pot Clavulanate] Hives    Social History:   Social History   Socioeconomic History   Marital status: Divorced    Spouse name: Not on file   Number of children: Not on file   Years of education: Not on file   Highest education level: Not on file  Occupational History   Not on file  Tobacco Use   Smoking status: Never   Smokeless tobacco: Never  Vaping Use   Vaping Use: Former   Quit date: 08/10/2017  Substance and Sexual Activity   Alcohol use: No    Alcohol/week: 0.0 standard drinks   Drug use: Never   Sexual activity: Yes  Other Topics Concern   Not on file  Social History Narrative   Not on file   Social Determinants of Health   Financial Resource Strain: Not on file  Food Insecurity: Not on file  Transportation Needs: Not on file  Physical Activity: Not on file  Stress: Not on file  Social Connections: Not on file  Intimate Partner Violence: Not on file    Family History:    History reviewed. No pertinent family history.   ROS:  Please see the history of present illness.  Review of Systems  Constitutional: Negative.  HENT: Negative.    Cardiovascular:  Positive for dyspnea on exertion.  Respiratory:  Positive for cough and shortness of breath.   Endocrine: Negative.   Hematologic/Lymphatic: Negative.   Musculoskeletal:  Positive for gout.  Gastrointestinal: Negative.   Genitourinary: Negative.   Neurological: Negative.    All other ROS reviewed and negative.     Physical Exam/Data:   Vitals:   02/20/21 0440 02/20/21 0500 02/20/21 0600 02/20/21 0630  BP: 117/80 123/76 128/88 116/82  Pulse:    66  Resp: 14 (!) 21 (!) 21 20  Temp:      TempSrc:      SpO2:    97%  Weight:      Height:        Intake/Output Summary  (Last 24 hours) at 02/20/2021 0755 Last data filed at 02/20/2021 0352 Gross per 24 hour  Intake 264.73 ml  Output --  Net 264.73 ml   Last 3 Weights 02/19/2021 08/24/2019 08/18/2019  Weight (lbs) 252 lb 235 lb 228 lb 8 oz  Weight (kg) 114.306 kg 106.595 kg 103.647 kg     Body mass index is 35.15 kg/m.  General:  Obese, in no acute distress  HEENT: normal Neck: no JVD Vascular: No carotid bruits; Distal pulses 2+ bilaterally Cardiac:  normal S1, S2; RRR; no murmur   Lungs:  clear to auscultation bilaterally, no wheezing, rhonchi or rales  Abd: soft, nontender,  no hepatomegaly  Ext: no edema Musculoskeletal:  No deformities, BUE and BLE strength normal and equal Skin: warm and dry  Neuro:  CNs 2-12 intact, no focal abnormalities noted Psych:  Normal affect   EKG:  The EKG was personally reviewed and demonstrates:  NSR, NSVT Telemetry:  Telemetry was personally reviewed and demonstrates:  freq runs NSVT now NSR on Amiodarone  Relevant CV Studies:    Laboratory Data:  High Sensitivity Troponin:   Recent Labs  Lab 02/19/21 2141 02/19/21 2351  TROPONINIHS 6 5     Chemistry Recent Labs  Lab 02/19/21 2141  NA 133*  K 3.7  CL 100  CO2 24  GLUCOSE 364*  BUN 10  CREATININE 1.07  CALCIUM 9.2  MG 1.8  GFRNONAA >60  ANIONGAP 9    No results for input(s): PROT, ALBUMIN, AST, ALT, ALKPHOS, BILITOT in the last 168 hours. Lipids No results for input(s): CHOL, TRIG, HDL, LABVLDL, LDLCALC, CHOLHDL in the last 168 hours.  Hematology Recent Labs  Lab 02/19/21 2141  WBC 11.9*  RBC 4.83  HGB 15.2  HCT 43.8  MCV 90.7  MCH 31.5  MCHC 34.7  RDW 12.5  PLT 248   Thyroid No results for input(s): TSH, FREET4 in the last 168 hours.  BNPNo results for input(s): BNP, PROBNP in the last 168 hours.  DDimer No results for input(s): DDIMER in the last 168 hours.   Radiology/Studies:  DG Chest Port 1 View  Result Date: 02/19/2021 CLINICAL DATA:  Dizziness and shortness of breath.  EXAM: PORTABLE CHEST 1 VIEW COMPARISON:  Chest x-ray 06/24/2019. FINDINGS: The heart size and mediastinal contours are within normal limits. Both lungs are clear. The visualized skeletal structures are unremarkable. IMPRESSION: No active disease. Electronically Signed   By: Ronney Asters M.D.   On: 02/19/2021 22:06     Assessment and Plan:    NSVT now controlled on Amiodarone. K 3.7, glucose 364. Strong family hx early CAD. Recommend heart cath and echo. Recheck bmet, Mg, trop.  I have reviewed the risks, indications, and alternatives to angioplasty and stenting with the patient. Risks include but are not limited to bleeding, infection, vascular injury, stroke, myocardial infection, arrhythmia, kidney injury, radiation-related injury in the case of prolonged fluoroscopy use, emergency cardiac surgery, and death. The patient understands the risks of serious complication is low (123456) and patient agrees to proceed.    Prediabetes on metformin-Elevated glucose 364-add statin  Family history of early CAD-father MI 4's, CABG 71's.  Obesity  Risk Assessment/Risk Scores:                For questions or updates, please contact [facility] Please consult [email or web address] for contact info under    Signed, Ermalinda Barrios, PA-C  02/20/2021 7:55 AM  Attending note  Patient seen and disucssed with PA Bonnell Public, I agree with her documentation. 51 yo male without significant PMH presnts with dizziness and SOB. His watch reported HRs were up to the 140s and presented to ER. He denies any chest pain. In ER episodes of NSVT, started on amiodarone in the ER.   K 3.7 Cr 1.07 BUN 10 WBC 11.9 Hgb 15.2 Plt 248 Mg 1.8 UDS neg CXR no acute process EKG SR, runs of monomorphic NSVT Trop 6-->5 COVID neg Flu neg  Patient presented with symptomatic frequent runs of NSVT, longest looks to be around 11 beats. Trops negative, EKG without signs of acute ischemia. Echo is pending. K 3.8 and Mg 1.8, being replaced  to goal K 4 and Mg 2. Started on amiodarone last night, frequent NSVT has resolved on amio. F/u echo results, would anticipate cath once echo results are back. I have asked echo tech to do his study next. Pending echo and cath findings may need to consider cMRI.   Prelim echo review LVE 55-60%, no WMAs, normal RV function   Carlyle Dolly MD

## 2021-02-20 NOTE — Consult Note (Signed)
Cardiology Consultation:   Patient ID: Derrick Barr MRN: [id number]; DOB: 11-04-1970  Admit date: 02/19/2021 Date of Consult: 02/20/2021  PCP:  Celene Squibb, MD   [facility] Providers Cardiologist:  Carlyle Dolly, MD   {    Patient Profile:   Derrick Barr is a 51 y.o. male with a hx of urolithiasis, gout, pre-DM who is being seen 02/20/2021 for the evaluation of recurrent NSVT at the request of Dr. Harl Bowie.  History of Present Illness:   [name] had COVID last sumer and since has had some subtle SOB, yesterday noted a clear increase in SOB and some dizzy/lightheaded spells and intermittent palpitations with  fast HR with just standing up, he was observed on telemetry to have recurrent NSVT, starte don amiodarone gtt and was admitted to [facility] yesterday   TTE noted LVEF 60-65%, no WMA, no significant VHD.  Given marked family hx of CAD planned to transfer to [facility] for cath and EP eval  LABS K+ 3.7 Mag 1.8 BUN/creat 10/1.07  BS 364 Hgb A1c 9.9  HS Trop 6,5  WBC 11.9 H/H 15/43 Plts 248   Past Medical History:  Diagnosis Date   Anxiety    Chronic gout followed by pcp   08-11-2019 per pt last episode left knee 06/ 2021   Depression    Dysuria    Hematuria    History of kidney stones    OA (osteoarthritis)    knees, hips   Prediabetes    Renal calculus, bilateral    Urgency of urination     Past Surgical History:  Procedure Laterality Date   CYSTOSCOPY WITH RETROGRADE PYELOGRAM, URETEROSCOPY AND STENT PLACEMENT Bilateral 07/31/2019   Procedure: CYSTOSCOPY WITH RETROGRADE PYELOGRAM, URETEROSCOPY AND STENT PLACEMENT;  Surgeon: Alexis Frock, MD;  Location: [facility];  Service: Urology;  Laterality: Bilateral;   CYSTOSCOPY WITH RETROGRADE PYELOGRAM, URETEROSCOPY AND STENT PLACEMENT Bilateral 08/18/2019   Procedure: CYSTOSCOPY WITH RETROGRADE PYELOGRAM, URETEROSCOPY AND STENT PLACEMENT;  Surgeon: Alexis Frock, MD;  Location: [facility];   Service: Urology;  Laterality: Bilateral;  75 MINS   EYE SURGERY Left age 34   BB 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   HOLMIUM LASER APPLICATION Bilateral [id number]   Procedure: HOLMIUM LASER APPLICATION;  Surgeon: Alexis Frock, MD;  Location: [facility];  Service: Urology;  Laterality: Bilateral;   TOTAL HIP ARTHROPLASTY Left 06/28/2019   Procedure: LEFT TOTAL HIP ARTHROPLASTY ANTERIOR APPROACH;  Surgeon: Leandrew Koyanagi, MD;  Location: [place];  Service: Orthopedics;  Laterality: Left;   WISDOM TOOTH EXTRACTION       Home Medications:  Prior to Admission medications   Medication Sig Start Date End Date Taking? Authorizing Provider  allopurinol (ZYLOPRIM) 100 MG tablet Take 1 tablet by mouth daily as needed (gout).   Yes [provider]  diclofenac Sodium (VOLTAREN) 1 % GEL Apply 2 g topically 2 (two) times daily as needed (joint pain).   Yes [provider]  metFORMIN (GLUCOPHAGE-XR) 500 MG 24 hr tablet Take 500 mg by mouth daily. 02/13/21  Yes [provider]  colchicine 0.6 MG tablet Take 1 tablet (0.6 mg total) by mouth 2 (two) times daily as needed. Patient not taking: Reported on 02/20/2021 07/06/19   Leandrew Koyanagi, MD  febuxostat (ULORIC) 40 MG tablet Take 1 tablet (40 mg total) by mouth daily. Patient not taking: Reported on 02/20/2021 07/13/19   Leandrew Koyanagi, MD  ketorolac (TORADOL) 10 MG tablet Take 1  tablet (10 mg total) by mouth every 8 (eight) hours as needed for moderate pain. Or stent discomfort post-operatively. Patient not taking: Reported on 02/20/2021 08/18/19   Alexis Frock, MD  methocarbamol (ROBAXIN) 750 MG tablet Take 1 tablet (750 mg total) by mouth 2 (two) times daily as needed for muscle spasms. Patient not taking: Reported on 02/20/2021 06/27/19   Leandrew Koyanagi, MD  senna-docusate (SENOKOT S) 8.6-50 MG tablet Take 1-2 tablets by mouth at bedtime as needed. Patient not taking: Reported on 02/20/2021 06/27/19   Leandrew Koyanagi, MD     Inpatient Medications: Scheduled Meds:  aspirin  81 mg Oral Pre-Cath   [MAR Hold] aspirin EC  81 mg Oral Daily   [MAR Hold] atorvastatin  80 mg Oral q1800   [MAR Hold] enoxaparin (LOVENOX) injection  110 mg Subcutaneous Q12H   [MAR Hold] insulin aspart  0-9 Units Subcutaneous Q4H   Continuous Infusions:  sodium chloride 1 mL/kg/hr (02/20/21 1602)   amiodarone 30 mg/hr (02/20/21 0950)   PRN Meds: [id number] Hold] acetaminophen, fentaNYL, Heparin (Porcine) in NaCl, heparin sodium (porcine), lidocaine (PF), midazolam, [MAR Hold] nitroGLYCERIN, [MAR Hold] ondansetron (ZOFRAN) IV, Radial Cocktail/Verapamil only  Allergies:    Allergies  Allergen Reactions   Augmentin [Amoxicillin-Pot Clavulanate] Hives    Social History:   Social History   Socioeconomic History   Marital status: Divorced    Spouse name: Not on file   Number of children: Not on file   Years of education: Not on file   Highest education level: Not on file  Occupational History   Not on file  Tobacco Use   Smoking status: Never   Smokeless tobacco: Never  Vaping Use   Vaping Use: Former   Quit date: 08/10/2017  Substance and Sexual Activity   Alcohol use: No    Alcohol/week: 0.0 standard drinks   Drug use: Never   Sexual activity: Yes  Other Topics Concern   Not on file  Social History Narrative   Not on file   Social Determinants of Health   Financial Resource Strain: Not on file  Food Insecurity: Not on file  Transportation Needs: Not on file  Physical Activity: Not on file  Stress: Not on file  Social Connections: Not on file  Intimate Partner Violence: Not on file    Family History:   Family History  Problem Relation Age of Onset   Heart attack Father 41     ROS:  Please see the history of present illness.  All other ROS reviewed and negative.     Physical Exam/Data:   Vitals:   02/20/21 1330 02/20/21 1400 02/20/21 1600 02/20/21 1641  BP: 120/81 126/85 (!) 147/81   Pulse: 70 71 83    Resp: 19  17   Temp:      TempSrc:      SpO2: 94% 91% 100% 99%  Weight:      Height:        Intake/Output Summary (Last 24 hours) at 02/20/2021 1656 Last data filed at 02/20/2021 1115 Gross per 24 hour  Intake 314.73 ml  Output --  Net 314.73 ml   Last 3 Weights 02/19/2021 08/24/2019 08/18/2019  Weight (lbs) 252 lb 235 lb 228 lb 8 oz  Weight (kg) 114.306 kg 106.595 kg 103.647 kg     Body mass index is 35.15 kg/m.   Examined by Dr. Quentin Ore General:  Well nourished, well developed, in no acute distress HEENT: normal Neck: no JVD Vascular: No carotid  bruits; Distal pulses 2+ bilaterally Cardiac: RRR; no murmurs, rubs or gallops Lungs:  CTA b/l, no wheezing, rhonchi or rales  Abd: soft, nontender, no hepatomegaly  Ext: no edema Musculoskeletal:  No deformities Skin: warm and dry  Neuro:   no focal abnormalities noted Psych:  Normal affect   EKG:  The EKG was personally reviewed and demonstrates:   SR with NSVT's SR 74bpm, no ectopy  Telemetry:  Telemetry was personally reviewed and demonstrates:   In cath lab  Relevant CV Studies:  02/20/21: TTE IMPRESSIONS   1. Left ventricular ejection fraction, by estimation, is 60 to 65%. The  left ventricle has normal function. The left ventricle has no regional  wall motion abnormalities. Left ventricular diastolic parameters are  indeterminate.   2. Right ventricular systolic function is normal. The right ventricular  size is normal.   3. The mitral valve is normal in structure. No evidence of mitral valve  regurgitation. No evidence of mitral stenosis.   4. The aortic valve is tricuspid. Aortic valve regurgitation is not  visualized. No aortic stenosis is present.   5. IVC is small suggesting low RA pressure and hypovolemia.   Laboratory Data:  High Sensitivity Troponin:   Recent Labs  Lab 02/19/21 2141 02/19/21 2351  TROPONINIHS 6 5     Chemistry Recent Labs  Lab 02/19/21 2141  NA 133*  K 3.7  CL 100  CO2 24   GLUCOSE 364*  BUN 10  CREATININE 1.07  CALCIUM 9.2  MG 1.8  GFRNONAA >60  ANIONGAP 9    No results for input(s): PROT, ALBUMIN, AST, ALT, ALKPHOS, BILITOT in the last 168 hours. Lipids No results for input(s): CHOL, TRIG, HDL, LABVLDL, LDLCALC, CHOLHDL in the last 168 hours.  Hematology Recent Labs  Lab 02/19/21 2141  WBC 11.9*  RBC 4.83  HGB 15.2  HCT 43.8  MCV 90.7  MCH 31.5  MCHC 34.7  RDW 12.5  PLT 248   Thyroid No results for input(s): TSH, FREET4 in the last 168 hours.  BNPNo results for input(s): BNP, PROBNP in the last 168 hours.  DDimer No results for input(s): DDIMER in the last 168 hours.   Radiology/Studies:  DG Chest Port 1 View Result Date: 02/19/2021 CLINICAL DATA:  Dizziness and shortness of breath. EXAM: PORTABLE CHEST 1 VIEW COMPARISON:  Chest x-ray 06/24/2019. FINDINGS: The heart size and mediastinal contours are within normal limits. Both lungs are clear. The visualized skeletal structures are unremarkable. IMPRESSION: No active disease. Electronically Signed   By: Ronney Asters M.D.   On: 02/19/2021 22:06   Assessment and Plan:   NSVT episodes  Dr. Eino Farber has seen the patient Plan pending cath findings   Risk Assessment/Risk Scores:     For questions or updates, please contact [facility] Please consult [email or web address] for contact info under    Signed, Baldwin Jamaica, PA-C  02/20/2021 4:56 PM

## 2021-02-20 NOTE — Plan of Care (Signed)
  Problem: Health Behavior/Discharge Planning: Goal: Ability to manage health-related needs will improve Outcome: Progressing   Problem: Education: Goal: Knowledge of General Education information will improve Description: Including pain rating scale, medication(s)/side effects and non-pharmacologic comfort measures Outcome: Progressing   Problem: Health Behavior/Discharge Planning: Goal: Ability to manage health-related needs will improve Outcome: Progressing   Problem: Clinical Measurements: Goal: Ability to maintain clinical measurements within normal limits will improve Outcome: Progressing Goal: Will remain free from infection Outcome: Progressing Goal: Diagnostic test results will improve Outcome: Progressing Goal: Respiratory complications will improve Outcome: Progressing Goal: Cardiovascular complication will be avoided Outcome: Progressing   Problem: Activity: Goal: Risk for activity intolerance will decrease Outcome: Progressing   Problem: Nutrition: Goal: Adequate nutrition will be maintained Outcome: Progressing   Problem: Coping: Goal: Level of anxiety will decrease Outcome: Progressing   Problem: Elimination: Goal: Will not experience complications related to bowel motility Outcome: Progressing Goal: Will not experience complications related to urinary retention Outcome: Progressing   Problem: Pain Managment: Goal: General experience of comfort will improve Outcome: Progressing   Problem: Safety: Goal: Ability to remain free from injury will improve Outcome: Progressing   Problem: Skin Integrity: Goal: Risk for impaired skin integrity will decrease Outcome: Progressing   

## 2021-02-20 NOTE — Interval H&P Note (Signed)
Cath Lab Visit (complete for each Cath Lab visit)  Clinical Evaluation Leading to the Procedure:   ACS: Yes.    Non-ACS:    Anginal Classification: CCS IV  Anti-ischemic medical therapy: Minimal Therapy (1 class of medications)  Non-Invasive Test Results: No non-invasive testing performed  Prior CABG: No previous CABG   Nonsustained VT   History and Physical Interval Note:  02/20/2021 4:39 PM  Derrick Barr  has presented today for surgery, with the diagnosis of chest pain.  The various methods of treatment have been discussed with the patient and family. After consideration of risks, benefits and other options for treatment, the patient has consented to  Procedure(s): LEFT HEART CATH AND CORONARY ANGIOGRAPHY (N/A) as a surgical intervention.  The patient's history has been reviewed, patient examined, no change in status, stable for surgery.  I have reviewed the patient's chart and labs.  Questions were answered to the patient's satisfaction.     Lance Muss

## 2021-02-20 NOTE — H&P (Addendum)
Cardiology H&P    Patient ID: Derrick Barr MRN: [id number]; DOB: 07-17-1970   Admit date: 02/19/2021 Date of Consult: 02/20/2021   PCP:  Celene Squibb, MD              [place] Providers Cardiologist:  Carlyle Dolly, MD          Patient Profile:    Derrick Barr is a 51 y.o. male with a hx of urolithiasis, gout, who is being seen 02/20/2021 for the evaluation of NSVT at the request of Dr. Melina Copa.   History of Present Illness:    Derrick Barr with history of gout, family hx early CAD, prediabetes on metformin. Yest at work became SOB and dizzy and HR on watch 115. He had several runs NSVT on EKG and tele-8 beats longest. K 3.7, glucose 364, WBC 11.9 otherwise labs unremarkable. Now NSR on IV Amio. Patient says he's had some SOB since Covid19 last summer but yest became worse and HR jumped up with standing. He denies any chest pain, drug use, smoking, ETOH, HTN, HLD.Drinks 16 oz coffee and 3 green teas daily.  His father had MI in 40's, CABG in 50's and still living 46. Sister is a Marine scientist at [employer]. 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         Past Medical History:  Diagnosis Date   Anxiety     Chronic gout followed by pcp    08-11-2019 per pt last episode left knee 06/ 2021   Depression     Dysuria     Hematuria     History of kidney stones     OA (osteoarthritis)      knees, hips   Renal calculus, bilateral     Urgency of urination             Past Surgical History:  Procedure Laterality Date   CYSTOSCOPY WITH RETROGRADE PYELOGRAM, URETEROSCOPY AND STENT PLACEMENT Bilateral 07/31/2019    Procedure: CYSTOSCOPY WITH RETROGRADE PYELOGRAM, URETEROSCOPY AND STENT PLACEMENT;  Surgeon: Alexis Frock, MD;  Location: [facility];  Service: Urology;  Laterality: Bilateral;   CYSTOSCOPY WITH RETROGRADE PYELOGRAM, URETEROSCOPY AND STENT PLACEMENT Bilateral 08/18/2019    Procedure: CYSTOSCOPY WITH RETROGRADE PYELOGRAM, URETEROSCOPY AND STENT  PLACEMENT;  Surgeon: Alexis Frock, MD;  Location: [facility];  Service: Urology;  Laterality: Bilateral;  75 MINS   EYE SURGERY Left age 3    BB 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   HOLMIUM LASER APPLICATION Bilateral [id number]    Procedure: HOLMIUM LASER APPLICATION;  Surgeon: Alexis Frock, MD;  Location: [facility];  Service: Urology;  Laterality: Bilateral;   TOTAL HIP ARTHROPLASTY Left 06/28/2019    Procedure: LEFT TOTAL HIP ARTHROPLASTY ANTERIOR APPROACH;  Surgeon: Leandrew Koyanagi, MD;  Location: [place];  Service: Orthopedics;  Laterality: Left;   WISDOM TOOTH EXTRACTION          Home Medications:         Prior to Admission medications   Medication Sig Start Date End Date Taking? Authorizing Provider  allopurinol (ZYLOPRIM) 100 MG tablet Take 1 tablet by mouth daily as needed (gout).     Yes [provider]  diclofenac Sodium (VOLTAREN) 1 % GEL Apply 2 g topically 2 (two) times daily as needed (joint pain).     Yes [provider]  metFORMIN (GLUCOPHAGE-XR) 500 MG 24 hr tablet Take 500 mg by mouth daily. 02/13/21  Yes [provider]  colchicine 0.6 MG tablet Take 1 tablet (0.6 mg total) by mouth 2 (two) times daily as needed. Patient not taking: Reported on 02/20/2021 07/06/19     Leandrew Koyanagi, MD  febuxostat (ULORIC) 40 MG tablet Take 1 tablet (40 mg total) by mouth daily. Patient not taking: Reported on 02/20/2021 07/13/19     Leandrew Koyanagi, MD  ketorolac (TORADOL) 10 MG tablet Take 1 tablet (10 mg total) by mouth every 8 (eight) hours as needed for moderate pain. Or stent discomfort post-operatively. Patient not taking: Reported on 02/20/2021 08/18/19     Alexis Frock, MD  methocarbamol (ROBAXIN) 750 MG tablet Take 1 tablet (750 mg total) by mouth 2 (two) times daily as needed for muscle spasms. Patient not taking: Reported on 02/20/2021 06/27/19     Leandrew Koyanagi, MD  senna-docusate (SENOKOT S) 8.6-50 MG tablet Take 1-2 tablets by  mouth at bedtime as needed. Patient not taking: Reported on 02/20/2021 06/27/19     Leandrew Koyanagi, MD      Inpatient Medications: Scheduled Meds:   Continuous Infusions:  amiodarone 30 mg/hr (02/20/21 0352)    PRN Meds:     Allergies:        Allergies  Allergen Reactions   Augmentin [Amoxicillin-Pot Clavulanate] Hives      Social History:   Social History         Socioeconomic History   Marital status: Divorced      Spouse name: Not on file   Number of children: Not on file   Years of education: Not on file   Highest education level: Not on file  Occupational History   Not on file  Tobacco Use   Smoking status: Never   Smokeless tobacco: Never  Vaping Use   Vaping Use: Former   Quit date: 08/10/2017  Substance and Sexual Activity   Alcohol use: No      Alcohol/week: 0.0 standard drinks   Drug use: Never   Sexual activity: Yes  Other Topics Concern   Not on file  Social History Narrative   Not on file    Social Determinants of Health    Financial Resource Strain: Not on file  Food Insecurity: Not on file  Transportation Needs: Not on file  Physical Activity: Not on file  Stress: Not on file  Social Connections: Not on file  Intimate Partner Violence: Not on file    Family History:    History reviewed. No pertinent family history.    ROS:  Please see the history of present illness.  Review of Systems  Constitutional: Negative.  HENT: Negative.    Cardiovascular:  Positive for dyspnea on exertion.  Respiratory:  Positive for cough and shortness of breath.   Endocrine: Negative.   Hematologic/Lymphatic: Negative.   Musculoskeletal:  Positive for gout.  Gastrointestinal: Negative.   Genitourinary: Negative.   Neurological: Negative.     All other ROS reviewed and negative.      Physical Exam/Data:          Vitals:    02/20/21 0440 02/20/21 0500 02/20/21 0600 02/20/21 0630  BP: 117/80 123/76 128/88 116/82  Pulse:       66  Resp: 14 (!) 21 (!)  21 20  Temp:          TempSrc:          SpO2:       97%  Weight:  Height:              Intake/Output Summary (Last 24 hours) at 02/20/2021 0755 Last data filed at 02/20/2021 0352    Gross per 24 hour  Intake 264.73 ml  Output --  Net 264.73 ml    Last 3 Weights 02/19/2021 08/24/2019 08/18/2019  Weight (lbs) 252 lb 235 lb 228 lb 8 oz  Weight (kg) 114.306 kg 106.595 kg 103.647 kg     Body mass index is 35.15 kg/m.  General:  Obese, in no acute distress  HEENT: normal Neck: no JVD Vascular: No carotid bruits; Distal pulses 2+ bilaterally Cardiac:  normal S1, S2; RRR; no murmur   Lungs:  clear to auscultation bilaterally, no wheezing, rhonchi or rales  Abd: soft, nontender, no hepatomegaly  Ext: no edema Musculoskeletal:  No deformities, BUE and BLE strength normal and equal Skin: warm and dry  Neuro:  CNs 2-12 intact, no focal abnormalities noted Psych:  Normal affect    EKG:  The EKG was personally reviewed and demonstrates:  NSR, NSVT Telemetry:  Telemetry was personally reviewed and demonstrates:  freq runs NSVT now NSR on Amiodarone   Relevant CV Studies:     Laboratory Data:   High Sensitivity Troponin:   Last Labs       Recent Labs  Lab 02/19/21 2141 02/19/21 2351  TROPONINIHS 6 5       Chemistry Last Labs      Recent Labs  Lab 02/19/21 2141  NA 133*  K 3.7  CL 100  CO2 24  GLUCOSE 364*  BUN 10  CREATININE 1.07  CALCIUM 9.2  MG 1.8  GFRNONAA >60  ANIONGAP 9      Last Labs   No results for input(s): PROT, ALBUMIN, AST, ALT, ALKPHOS, BILITOT in the last 168 hours.   Lipids  Last Labs   No results for input(s): CHOL, TRIG, HDL, LABVLDL, LDLCALC, CHOLHDL in the last 168 hours.    Hematology Last Labs      Recent Labs  Lab 02/19/21 2141  WBC 11.9*  RBC 4.83  HGB 15.2  HCT 43.8  MCV 90.7  MCH 31.5  MCHC 34.7  RDW 12.5  PLT 248      Thyroid  Last Labs   No results for input(s): TSH, FREET4 in the last 168 hours.     BNP Last Labs   No results for input(s): BNP, PROBNP in the last 168 hours.    DDimer  Last Labs   No results for input(s): DDIMER in the last 168 hours.       Radiology/Studies:  DG Chest Port 1 View   Result Date: 02/19/2021 CLINICAL DATA:  Dizziness and shortness of breath. EXAM: PORTABLE CHEST 1 VIEW COMPARISON:  Chest x-ray 06/24/2019. FINDINGS: The heart size and mediastinal contours are within normal limits. Both lungs are clear. The visualized skeletal structures are unremarkable. IMPRESSION: No active disease. Electronically Signed   By: Darliss Cheney M.D.   On: 02/19/2021 22:06       Assessment and Plan:      NSVT now controlled on Amiodarone. K 3.7, glucose 364. Strong family hx early CAD. Recommend heart cath and echo. Recheck bmet, Mg, trop.   I have reviewed the risks, indications, and alternatives to angioplasty and stenting with the patient. Risks include but are not limited to bleeding, infection, vascular injury, stroke, myocardial infection, arrhythmia, kidney injury, radiation-related injury in the case of prolonged fluoroscopy use, emergency cardiac surgery, and  death. The patient understands the risks of serious complication is low (123456) and patient agrees to proceed.     Prediabetes on metformin-Elevated glucose 364-add statin   Family history of early CAD-father MI 79's, CABG 60's.   Obesity   Risk Assessment/Risk Scores:                  For questions or updates, please contact [facility] Please consult [email or web address] for contact info under      Signed, Ermalinda Barrios, PA-C  02/20/2021 7:55 AM   Attending note   Patient seen and disucssed with PA Bonnell Public, I agree with her documentation. 51 yo male without significant PMH presnts with dizziness and SOB. His watch reported HRs were up to the 140s and presented to ER. He denies any chest pain. In ER episodes of NSVT, started on amiodarone in the ER.     K 3.7 Cr 1.07 BUN 10 WBC 11.9 Hgb 15.2  Plt 248 Mg 1.8 UDS neg CXR no acute process EKG SR, runs of monomorphic NSVT Trop 6-->5 COVID neg Flu neg   Patient presented with symptomatic frequent runs of NSVT, longest looks to be around 11 beats. Trops negative, EKG without signs of acute ischemia. Echo is pending. K 3.8 and Mg 1.8, being replaced to goal K 4 and Mg 2. Started on amiodarone last night, frequent NSVT has resolved on amio. F/u echo results, would anticipate cath once echo results are back. I have asked echo tech to do his study next. Pending echo and cath findings may need to consider cMRI.    Prelim echo review LVE 55-60%, no WMAs, normal RV function  Spoke with Dr Quentin Ore from EP service who will also see patient at [facility].      Carlyle Dolly MD

## 2021-02-21 ENCOUNTER — Inpatient Hospital Stay (HOSPITAL_COMMUNITY): Payer: PRIVATE HEALTH INSURANCE

## 2021-02-21 ENCOUNTER — Encounter (HOSPITAL_COMMUNITY): Payer: Self-pay | Admitting: Interventional Cardiology

## 2021-02-21 DIAGNOSIS — I4729 Other ventricular tachycardia: Secondary | ICD-10-CM

## 2021-02-21 LAB — GLUCOSE, CAPILLARY
Glucose-Capillary: 141 mg/dL — ABNORMAL HIGH (ref 70–99)
Glucose-Capillary: 156 mg/dL — ABNORMAL HIGH (ref 70–99)
Glucose-Capillary: 161 mg/dL — ABNORMAL HIGH (ref 70–99)
Glucose-Capillary: 173 mg/dL — ABNORMAL HIGH (ref 70–99)
Glucose-Capillary: 177 mg/dL — ABNORMAL HIGH (ref 70–99)
Glucose-Capillary: 188 mg/dL — ABNORMAL HIGH (ref 70–99)
Glucose-Capillary: 221 mg/dL — ABNORMAL HIGH (ref 70–99)

## 2021-02-21 LAB — BASIC METABOLIC PANEL
Anion gap: 9 (ref 5–15)
BUN: 8 mg/dL (ref 6–20)
CO2: 23 mmol/L (ref 22–32)
Calcium: 9.1 mg/dL (ref 8.9–10.3)
Chloride: 104 mmol/L (ref 98–111)
Creatinine, Ser: 0.92 mg/dL (ref 0.61–1.24)
GFR, Estimated: >60 mL/min (ref >60)
Glucose, Bld: 185 mg/dL — ABNORMAL HIGH (ref 70–99)
Potassium: 3.7 mmol/L (ref 3.5–5.1)
Sodium: 136 mmol/L (ref 135–145)

## 2021-02-21 LAB — LIPID PANEL
Cholesterol: 181 mg/dL (ref 0–200)
HDL: 27 mg/dL — ABNORMAL LOW (ref >40)
LDL Cholesterol: 102 mg/dL — ABNORMAL HIGH (ref 0–99)
Total CHOL/HDL Ratio: 6.7 RATIO
Triglycerides: 258 mg/dL — ABNORMAL HIGH (ref <150)
VLDL: 52 mg/dL — ABNORMAL HIGH (ref 0–40)

## 2021-02-21 MED ORDER — METOPROLOL SUCCINATE ER 25 MG PO TB24
25.0000 mg | ORAL_TABLET | Freq: Two times a day (BID) | ORAL | Status: AC
  Administered 2021-02-21: 25 mg via ORAL
  Filled 2021-02-21: qty 1

## 2021-02-21 MED ORDER — LIVING WELL WITH DIABETES BOOK
Freq: Once | Status: AC
  Filled 2021-02-21: qty 1

## 2021-02-21 MED ORDER — METOPROLOL SUCCINATE ER 25 MG PO TB24
25.0000 mg | ORAL_TABLET | Freq: Once | ORAL | Status: AC
  Administered 2021-02-21: 25 mg via ORAL

## 2021-02-21 MED ORDER — METOPROLOL SUCCINATE ER 50 MG PO TB24
50.0000 mg | ORAL_TABLET | Freq: Every day | ORAL | Status: DC
Start: 2021-02-21 — End: 2021-02-21

## 2021-02-21 MED ORDER — GADOBUTROL 1 MMOL/ML IV SOLN
10.0000 mL | Freq: Once | INTRAVENOUS | Status: AC | PRN
  Administered 2021-02-21: 10 mL via INTRAVENOUS

## 2021-02-21 MED ORDER — METOPROLOL SUCCINATE ER 50 MG PO TB24: 50.0000 mg | ORAL_TABLET | Freq: Two times a day (BID) | ORAL | Status: DC

## 2021-02-21 MED ORDER — METOPROLOL SUCCINATE ER 50 MG PO TB24
75.0000 mg | ORAL_TABLET | Freq: Two times a day (BID) | ORAL | Status: DC
  Administered 2021-02-22: 75 mg via ORAL
  Filled 2021-02-21 (×2): qty 1

## 2021-02-21 NOTE — Progress Notes (Signed)
Pt reporting chest discomfort. Pt denies pain or pressure but can not describe what it feels like. EKG obtained, nitro given x1. MD notified.

## 2021-02-21 NOTE — Plan of Care (Signed)
  Problem: Health Behavior/Discharge Planning: Goal: Ability to manage health-related needs will improve Outcome: Progressing   Problem: Education: Goal: Knowledge of General Education information will improve Description: Including pain rating scale, medication(s)/side effects and non-pharmacologic comfort measures Outcome: Progressing   Problem: Health Behavior/Discharge Planning: Goal: Ability to manage health-related needs will improve Outcome: Progressing   Problem: Clinical Measurements: Goal: Ability to maintain clinical measurements within normal limits will improve Outcome: Progressing Goal: Will remain free from infection Outcome: Progressing Goal: Diagnostic test results will improve Outcome: Progressing Goal: Respiratory complications will improve Outcome: Progressing Goal: Cardiovascular complication will be avoided Outcome: Progressing   Problem: Activity: Goal: Risk for activity intolerance will decrease Outcome: Progressing   Problem: Nutrition: Goal: Adequate nutrition will be maintained Outcome: Progressing   Problem: Coping: Goal: Level of anxiety will decrease Outcome: Progressing   Problem: Elimination: Goal: Will not experience complications related to bowel motility Outcome: Progressing Goal: Will not experience complications related to urinary retention Outcome: Progressing   Problem: Pain Managment: Goal: General experience of comfort will improve Outcome: Progressing   Problem: Safety: Goal: Ability to remain free from injury will improve Outcome: Progressing   Problem: Skin Integrity: Goal: Risk for impaired skin integrity will decrease Outcome: Progressing   

## 2021-02-21 NOTE — TOC Progression Note (Signed)
Transition of Care [facility]) - Progression Note    Patient Details  Name: Derrick Barr MRN: [id number] Date of Birth: 05/05/1970  [facility]) CM/SW Contact  Zenon Mayo, RN Phone Number: 02/21/2021, 8:13 AM  Clinical Narrative:     Transition of Care [facility]) Screening Note   Patient Details  Name: Derrick Barr Date of Birth: May 10, 1970   Transition of Care [facility]) CM/SW Contact:    Zenon Mayo, RN Phone Number: 02/21/2021, 8:13 AM    Transition of Care Department [facility]) has reviewed patient and no TOC needs have been identified at this time. We will continue to monitor patient advancement through interdisciplinary progression rounds. If new patient transition needs arise, please place a TOC consult.          Expected Discharge Plan and Services                                                 Social Determinants of Health (SDOH) Interventions    Readmission Risk Interventions No flowsheet data found.

## 2021-02-21 NOTE — Progress Notes (Addendum)
Inpatient Diabetes Program Recommendations  AACE/ADA: New Consensus Statement on Inpatient Glycemic Control (2015)  Target Ranges:  Prepandial:   less than 140 mg/dL      Peak postprandial:   less than 180 mg/dL (1-2 hours)      Critically ill patients:  140 - 180 mg/dL   Lab Results  Component Value Date   GLUCAP 221 (H) 02/21/2021   HGBA1C 9.9 (H) 02/19/2021    Review of Glycemic Control  Latest Reference Range & Units 02/21/21 00:02 02/21/21 04:25 02/21/21 07:36 02/21/21 10:54  Glucose-Capillary 70 - 99 mg/dL 188 (H) 177 (H) 161 (H) 221 (H)   Diabetes history: DM 2 Outpatient Diabetes medications:  Metformin XR 500 mg daily Current orders for Inpatient glycemic control:  Novolog sensitive q 4 hours Inpatient Diabetes Program Recommendations:   Consider adding Semglee 10 units daily. A1C indicated uncontrolled DM.   Thanks  Adah Perl, RN, BC-ADM Inpatient Diabetes Coordinator Pager [phone]  (8a-5p)

## 2021-02-21 NOTE — Progress Notes (Addendum)
Progress Note  Patient Name: Derrick Barr Date of Encounter: 02/21/2021  [facility] Cardiologist: Dina RichBranch, Jonathan, MD   Subjective   Feels well  Inpatient Medications    Scheduled Meds:  aspirin EC  81 mg Oral Daily   atorvastatin  80 mg Oral q1800   enoxaparin (LOVENOX) injection  110 mg Subcutaneous Q12H   insulin aspart  0-9 Units Subcutaneous Q4H   metoprolol succinate  25 mg Oral BID   sodium chloride flush  3 mL Intravenous Q12H   Continuous Infusions:  sodium chloride     PRN Meds: sodium chloride, acetaminophen, nitroGLYCERIN, ondansetron (ZOFRAN) IV, sodium chloride flush   Vital Signs    Vitals:   02/20/21 2257 02/20/21 2345 02/21/21 0419 02/21/21 0645  BP: 120/76 109/68 122/86   Pulse: 75 67 66   Resp:  (!) 21 (!) 21   Temp:  97.8 F (36.6 C) 98 F (36.7 C)   TempSrc:  Oral Oral   SpO2:  95% 96%   Weight:    114.6 kg  Height:        Intake/Output Summary (Last 24 hours) at 02/21/2021 0803 Last data filed at 02/21/2021 0421 Gross per 24 hour  Intake 1427.41 ml  Output 1200 ml  Net 227.41 ml   Last 3 Weights 02/21/2021 02/19/2021 08/24/2019  Weight (lbs) 252 lb 10.4 oz 252 lb 235 lb  Weight (kg) 114.6 kg 114.306 kg 106.595 kg      Telemetry    SR, occ/infrequent PVCs - Personally Reviewed  ECG    SR, 62bpm, normal intervals, PVC - Personally Reviewed  Physical Exam   GEN: No acute distress.   Neck: No JVD Cardiac: RRR, no murmurs, rubs, or gallops.  Respiratory: CTA b/l. GI: Soft, nontender, non-distended  MS: No edema; No deformity. Neuro:  Nonfocal  Psych: Normal affect   Labs    High Sensitivity Troponin:   Recent Labs  Lab 02/19/21 2141 02/19/21 2351  TROPONINIHS 6 5     Chemistry Recent Labs  Lab 02/19/21 2141 02/21/21 0454  NA 133* 136  K 3.7 3.7  CL 100 104  CO2 24 23  GLUCOSE 364* 185*  BUN 10 8  CREATININE 1.07 0.92  CALCIUM 9.2 9.1  MG 1.8  --   GFRNONAA >60 >60  ANIONGAP 9 9    Lipids  Recent  Labs  Lab 02/21/21 0454  CHOL 181  TRIG 258*  HDL 27*  LDLCALC 102*  CHOLHDL 6.7    Hematology Recent Labs  Lab 02/19/21 2141  WBC 11.9*  RBC 4.83  HGB 15.2  HCT 43.8  MCV 90.7  MCH 31.5  MCHC 34.7  RDW 12.5  PLT 248   Thyroid No results for input(s): TSH, FREET4 in the last 168 hours.  BNPNo results for input(s): BNP, PROBNP in the last 168 hours.  DDimer No results for input(s): DDIMER in the last 168 hours.   Radiology      Cardiac Studies   02/20/21: LHC   Mid LAD lesion is 25% stenosed.   Mid Cx lesion is 40% stenosed.   The left ventricular systolic function is normal.   LV end diastolic pressure is normal.   The left ventricular ejection fraction is 55-65% by visual estimate.   There is no aortic valve stenosis.   Mild, diffuse atherosclerosis noted throughout the coronary vessels.     02/20/21: TTE IMPRESSIONS   1. Left ventricular ejection fraction, by estimation, is 60 to 65%. The  left ventricle has normal function. The left ventricle has no regional  wall motion abnormalities. Left ventricular diastolic parameters are  indeterminate.   2. Right ventricular systolic function is normal. The right ventricular  size is normal.   3. The mitral valve is normal in structure. No evidence of mitral valve  regurgitation. No evidence of mitral stenosis.   4. The aortic valve is tricuspid. Aortic valve regurgitation is not  visualized. No aortic stenosis is present.   5. IVC is small suggesting low RA pressure and hypovolemia.   Patient Profile     51 y.o. 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   Transferred to [facility] for cath  Assessment & Plan    NSVT Felt to be LVOT Preserved LVEF and no obstructive CAD Amio stopped > Toprol with plans to titrate upwards as able  Dr. Quentin Ore has seen the pt this AM Hopefully can discharge tomorrow if  PVCs/Arrhythmia burden controlled with BB Await MRI (ordered) +/- ablation in the future  Stop lovenox (started for CP/NSTEMI (?)) He is ambulatory and no need for DVT prophylaxis   2. DM ooc Deferred to PMD attending   For questions or updates, please contact [facility] Please consult [email or web address] for contact info under        Signed, Baldwin Jamaica, PA-C  02/21/2021, 8:03 AM

## 2021-02-21 NOTE — Progress Notes (Signed)
Pt woke up reporting chest pressure 4/10 more pressure than pain. EKG complete. Nitro given x1 with relief. MD notified.

## 2021-02-21 NOTE — Progress Notes (Incomplete Revision)
Inpatient Diabetes Program Recommendations  AACE/ADA: New Consensus Statement on Inpatient Glycemic Control (2015)  Target Ranges:  Prepandial:   less than 140 mg/dL      Peak postprandial:   less than 180 mg/dL (1-2 hours)      Critically ill patients:  140 - 180 mg/dL   Lab Results  Component Value Date   GLUCAP 221 (H) 02/21/2021   HGBA1C 9.9 (H) 02/19/2021    Review of Glycemic Control  Latest Reference Range & Units 02/21/21 00:02 02/21/21 04:25 02/21/21 07:36 02/21/21 10:54  Glucose-Capillary 70 - 99 mg/dL 629 (H) 528 (H) 413 (H) 221 (H)   Diabetes history: DM 2 Outpatient Diabetes medications:  Metformin XR 500 mg daily Current orders for Inpatient glycemic control:  Novolog sensitive q 4 hours Inpatient Diabetes Program Recommendations:   Consider adding Semglee 10 units daily. A1C indicated uncontrolled DM.   Thanks  Beryl Meager, RN, BC-ADM Inpatient Diabetes Coordinator Pager [phone]  (8a-5p)  Addendum [phone]- Spoke with pt about elevated A1C.  He has been told in the past that he has "pre-diabetes" and was taking metformin.  Discussed A1C results with him and explained what an A1C is, basic pathophysiology of DM Type 2, basic home care, importance of checking CBGs and maintaining good CBG control to prevent long-term and short-term complications.   Have ordered educational booklet for patient.  Encouraged close f/u with PCP as well regarding elevated A1C. Patient states he does have insurance.  Please consider increasing Metformin at d/c to

## 2021-02-22 ENCOUNTER — Inpatient Hospital Stay (HOSPITAL_COMMUNITY)
Admit: 2021-02-22 | Discharge: 2021-02-22 | Disposition: A | Discharge status: Home / Self Care | Payer: PRIVATE HEALTH INSURANCE | Attending: Physician Assistant | Admitting: Physician Assistant

## 2021-02-22 ENCOUNTER — Encounter: Payer: Self-pay | Admitting: Physician Assistant

## 2021-02-22 DIAGNOSIS — I472 Ventricular tachycardia, unspecified: Secondary | ICD-10-CM

## 2021-02-22 LAB — CBC
HCT: 43.6 % (ref 39.0–52.0)
Hemoglobin: 15.1 g/dL (ref 13.0–17.0)
MCH: 31.1 pg (ref 26.0–34.0)
MCHC: 34.6 g/dL (ref 30.0–36.0)
MCV: 89.9 fL (ref 80.0–100.0)
Platelets: 218 10*3/uL (ref 150–400)
RBC: 4.85 MIL/uL (ref 4.22–5.81)
RDW: 12.5 % (ref 11.5–15.5)
WBC: 9.9 10*3/uL (ref 4.0–10.5)
nRBC: 0 % (ref 0.0–0.2)

## 2021-02-22 LAB — GLUCOSE, CAPILLARY
Glucose-Capillary: 157 mg/dL — ABNORMAL HIGH (ref 70–99)
Glucose-Capillary: 144 mg/dL — ABNORMAL HIGH (ref 70–99)

## 2021-02-22 MED ORDER — METOPROLOL SUCCINATE ER 25 MG PO TB24: 75.0000 mg | ORAL_TABLET | Freq: Two times a day (BID) | ORAL | 5 refills | Status: DC

## 2021-02-22 MED ORDER — ATORVASTATIN CALCIUM 80 MG PO TABS: 80.0000 mg | ORAL_TABLET | Freq: Every day | ORAL | 5 refills | Status: DC

## 2021-02-22 NOTE — Plan of Care (Signed)
  Problem: Health Behavior/Discharge Planning: Goal: Ability to manage health-related needs will improve Outcome: Progressing   Problem: Education: Goal: Knowledge of General Education information will improve Description: Including pain rating scale, medication(s)/side effects and non-pharmacologic comfort measures Outcome: Progressing   Problem: Health Behavior/Discharge Planning: Goal: Ability to manage health-related needs will improve Outcome: Progressing   Problem: Clinical Measurements: Goal: Ability to maintain clinical measurements within normal limits will improve Outcome: Progressing Goal: Will remain free from infection Outcome: Progressing Goal: Diagnostic test results will improve Outcome: Progressing Goal: Respiratory complications will improve Outcome: Progressing Goal: Cardiovascular complication will be avoided Outcome: Progressing   Problem: Activity: Goal: Risk for activity intolerance will decrease Outcome: Progressing   Problem: Nutrition: Goal: Adequate nutrition will be maintained Outcome: Progressing   Problem: Coping: Goal: Level of anxiety will decrease Outcome: Progressing   Problem: Elimination: Goal: Will not experience complications related to bowel motility Outcome: Progressing Goal: Will not experience complications related to urinary retention Outcome: Progressing   Problem: Pain Managment: Goal: General experience of comfort will improve Outcome: Progressing   Problem: Safety: Goal: Ability to remain free from injury will improve Outcome: Progressing   Problem: Skin Integrity: Goal: Risk for impaired skin integrity will decrease Outcome: Progressing   

## 2021-02-22 NOTE — Progress Notes (Signed)
Pt is alert and oriented x 4 with no complaints of pain nor discomfort. Pt is discharging home with packet of information including letter for work and diabetic education book. His mother and father are going to take him home.   Vital signs are all WNL and patient is stable. Care plan has been completed and all goals were met prior to discharge.

## 2021-02-22 NOTE — Plan of Care (Signed)
Problem: Health Behavior/Discharge Planning: °Goal: Ability to manage health-related needs will improve °02/22/2021 1248 by Atkinson, Leticia D, RN °Outcome: Completed/Met °02/22/2021 1122 by Atkinson, Leticia D, RN °Outcome: Progressing °  °Problem: Education: °Goal: Knowledge of General Education information will improve °Description: Including pain rating scale, medication(s)/side effects and non-pharmacologic comfort measures °02/22/2021 1248 by Atkinson, Leticia D, RN °Outcome: Completed/Met °02/22/2021 1122 by Atkinson, Leticia D, RN °Outcome: Progressing °  °Problem: Health Behavior/Discharge Planning: °Goal: Ability to manage health-related needs will improve °02/22/2021 1248 by Atkinson, Leticia D, RN °Outcome: Completed/Met °02/22/2021 1122 by Atkinson, Leticia D, RN °Outcome: Progressing °  °Problem: Clinical Measurements: °Goal: Ability to maintain clinical measurements within normal limits will improve °02/22/2021 1248 by Atkinson, Leticia D, RN °Outcome: Completed/Met °02/22/2021 1122 by Atkinson, Leticia D, RN °Outcome: Progressing °Goal: Will remain free from infection °02/22/2021 1248 by Atkinson, Leticia D, RN °Outcome: Completed/Met °02/22/2021 1122 by Atkinson, Leticia D, RN °Outcome: Progressing °Goal: Diagnostic test results will improve °02/22/2021 1248 by Atkinson, Leticia D, RN °Outcome: Completed/Met °02/22/2021 1122 by Atkinson, Leticia D, RN °Outcome: Progressing °Goal: Respiratory complications will improve °02/22/2021 1248 by Atkinson, Leticia D, RN °Outcome: Completed/Met °02/22/2021 1122 by Atkinson, Leticia D, RN °Outcome: Progressing °Goal: Cardiovascular complication will be avoided °02/22/2021 1248 by Atkinson, Leticia D, RN °Outcome: Completed/Met °02/22/2021 1122 by Atkinson, Leticia D, RN °Outcome: Progressing °  °Problem: Activity: °Goal: Risk for activity intolerance will decrease °02/22/2021 1248 by Atkinson, Leticia D, RN °Outcome: Completed/Met °02/22/2021 1122 by Atkinson, Leticia D, RN °Outcome:  Progressing °  °Problem: Nutrition: °Goal: Adequate nutrition will be maintained °02/22/2021 1248 by Atkinson, Leticia D, RN °Outcome: Completed/Met °02/22/2021 1122 by Atkinson, Leticia D, RN °Outcome: Progressing °  °Problem: Coping: °Goal: Level of anxiety will decrease °02/22/2021 1248 by Atkinson, Leticia D, RN °Outcome: Completed/Met °02/22/2021 1122 by Atkinson, Leticia D, RN °Outcome: Progressing °  °Problem: Elimination: °Goal: Will not experience complications related to bowel motility °02/22/2021 1248 by Atkinson, Leticia D, RN °Outcome: Completed/Met °02/22/2021 1122 by Atkinson, Leticia D, RN °Outcome: Progressing °Goal: Will not experience complications related to urinary retention °02/22/2021 1248 by Atkinson, Leticia D, RN °Outcome: Completed/Met °02/22/2021 1122 by Atkinson, Leticia D, RN °Outcome: Progressing °  °Problem: Pain Managment: °Goal: General experience of comfort will improve °02/22/2021 1248 by Atkinson, Leticia D, RN °Outcome: Completed/Met °02/22/2021 1122 by Atkinson, Leticia D, RN °Outcome: Progressing °  °Problem: Safety: °Goal: Ability to remain free from injury will improve °02/22/2021 1248 by Atkinson, Leticia D, RN °Outcome: Completed/Met °02/22/2021 1122 by Atkinson, Leticia D, RN °Outcome: Progressing °  °Problem: Skin Integrity: °Goal: Risk for impaired skin integrity will decrease °02/22/2021 1248 by Atkinson, Leticia D, RN °Outcome: Completed/Met °02/22/2021 1122 by Atkinson, Leticia D, RN °Outcome: Progressing °  °

## 2021-02-22 NOTE — TOC Transition Note (Addendum)
Transition of Care [facility]) - CM/SW Discharge Note   Patient Details  Name: RANGER PETRICH MRN: [id number] Date of Birth: 1970-06-30  [facility]) CM/SW Contact:  Leone Haven, RN Phone Number: 02/22/2021, 10:49 AM   Clinical Narrative:    NCM spoke with patient at bedside, he states he has [employer], and he goes to [facility] in [place]  [phone].  He states he has an apt coming up on Feb 7, he states he sees Dr. Scharlene Gloss PA.  Secretary will make a hospital follow up for patient. He states he has transportation home today by his Mom.  He has money to get his medications.  NCM took copy of patient's insurance information to admissions.    Final next level of care: Home/Self Care Barriers to Discharge: No Barriers Identified   Patient Goals and CMS Choice Patient states their goals for this hospitalization and ongoing recovery are:: return home   Choice offered to / list presented to : NA  Discharge Placement                       Discharge Plan and Services                  DME Agency: NA       HH Arranged: NA          Social Determinants of Health (SDOH) Interventions     Readmission Risk Interventions No flowsheet data found.

## 2021-02-22 NOTE — Discharge Instructions (Signed)
Given to patient by nurse

## 2021-02-22 NOTE — Discharge Summary (Signed)
DISCHARGE SUMMARY    Patient ID: Derrick Barr,  MRN: [id number], DOB/AGE: Jan 11, 1971 51 y.o.  Admit date: 02/19/2021 Discharge date: 02/22/2021  Primary Care Physician: Celene Squibb, MD  Primary Cardiologist: Dr. Harl Bowie (new) Electrophysiologist: Dr. Quentin Ore (new)  Primary Discharge Diagnosis:  NSVT  Secondary Discharge Diagnosis:  DM  Allergies  Allergen Reactions   Augmentin [Amoxicillin-Pot Clavulanate] Hives     Procedures This Admission:  02/20/21: LHC   Mid LAD lesion is 25% stenosed.   Mid Cx lesion is 40% stenosed.   The left ventricular systolic function is normal.   LV end diastolic pressure is normal.   The left ventricular ejection fraction is 55-65% by visual estimate.   There is no aortic valve stenosis.   Mild, diffuse atherosclerosis noted throughout the coronary vessels.    He needs aggressive risk factor modification including diabetes management, lipid lowering therapy and healthy lifestyle including healthy diet and regular exercise.   Brief HPI: Derrick Barr is a 51 y.o. male was admitted to [facility] with SOB and some dizzy/lightheaded spells and intermittent palpitations with  fast HR with just standing up, he was observed on telemetry to have recurrent NSVT, started on amiodarone gtt and was admitted to [facility]   TTE noted LVEF 60-65%, no WMA, no significant VHD. Given marked family hx of CAD planned to transfer to [facility] for cath and EP eval  Hospital Course:  The patient was transferred and underwent LHC with no obstructive disease, EP consulted, amiodarone stopped and started on betablocker  Maintained on telemetry noting only occasional single PVCs and no symptoms HgbA1c was 9.9, he has an appointment to see his PMD 02/27/21, we discussed at length the importance of DM control, and not to miss his appointment. The patient feels well, denies any CP or SOB, or cath site discomfort,  He was examined by Dr. Quentin Ore and considered stable for discharge  to home.   Home with Xio AT Toprol XL 75mg  TID Atorvastatin 80mg  QD EP follow up is in place  Physical Exam: Vitals:   02/21/21 2000 02/21/21 2327 02/22/21 0406 02/22/21 0835  BP: 126/75 108/64 108/75 132/82  Pulse: 67 71 60 70  Resp: 20  20 20   Temp: 97.6 F (36.4 C)  97.6 F (36.4 C) 97.8 F (36.6 C)  TempSrc: Oral  Oral Oral  SpO2: 95%  93% 93%  Weight:   113.7 kg   Height:        GEN- The patient is well appearing, alert and oriented x 3 today.   HEENT: normocephalic, atraumatic; sclera clear, conjunctiva pink; hearing intact; oropharynx clear Lungs- CTA b/l, normal work of breathing.  No wheezes, rales, rhonchi Heart- RRR, no murmurs, rubs or gallops, PMI not laterally displaced GI- soft, non-tender, non-distended Extremities- no clubbing, cyanosis, or edema, cath radial site (R) is stable, no bleeding, hematoma, pain MS- no significant deformity or atrophy Skin- warm and dry, no rash or lesion Psych- euthymic mood, full affect Neuro- no gross defecits  Labs:   Lab Results  Component Value Date   WBC 9.9 02/22/2021   HGB 15.1 02/22/2021   HCT 43.6 02/22/2021   MCV 89.9 02/22/2021   PLT 218 02/22/2021    Recent Labs  Lab 02/21/21 0454  NA 136  K 3.7  CL 104  CO2 23  BUN 8  CREATININE 0.92  CALCIUM 9.1  GLUCOSE 185*    Discharge Medications:  Allergies as of 02/22/2021  Reactions   Augmentin [amoxicillin-pot Clavulanate] Hives        Medication List     TAKE these medications    allopurinol 100 MG tablet Commonly known as: ZYLOPRIM Take 1 tablet by mouth daily as needed (gout).   atorvastatin 80 MG tablet Commonly known as: LIPITOR Take 1 tablet (80 mg total) by mouth daily at 6 PM.   diclofenac Sodium 1 % Gel Commonly known as: VOLTAREN Apply 2 g topically 2 (two) times daily as needed (joint pain).   metFORMIN 500 MG 24 hr tablet Commonly known as: GLUCOPHAGE-XR Take 500 mg by mouth daily.   metoprolol succinate 25 MG 24  hr tablet Commonly known as: TOPROL-XL Take 3 tablets (75 mg total) by mouth 2 (two) times daily.        Disposition: Home Discharge Instructions     Diet - low sodium heart healthy   Complete by: As directed    Increase activity slowly   Complete by: As directed        Follow-up Information     Celene Squibb, MD Follow up.   Specialty: Internal Medicine Why: make sure you do not miss you r appointment with him and discuss your diabetes management Contact information: [place] [facility] [address] [phone]         Shirley Friar, PA-C Follow up.   Specialty: Physician Assistant Why: 03/22/21 @ 9:40AM (for Dr. Quentin Ore) Contact information: [place] [address] [phone]                 Duration of Discharge Encounter: Greater than 30 minutes including physician time.  Venetia Night, PA-C 02/22/2021 10:04 AM

## 2021-02-22 NOTE — Plan of Care (Signed)
°  RD consulted for nutrition education regarding diabetes.   Lab Results  Component Value Date   HGBA1C 9.9 (H) 02/19/2021    RD provided "Carbohydrate Counting for People with Diabetes" and "Plate Method for Diabetes" handout from the Academy of Nutrition and Dietetics. Discussed different food groups and their effects on blood sugar. Provided list of carbohydrates and recommended serving sizes of common foods. Discussed choosing water, zero calorie beverages, or using artifical sweeteners to sweeten tea.   Discussed importance of controlled and consistent carbohydrate intake throughout the day. Provided examples of ways to balance meals/snacks. Encourage having protein foods with carbohydrates. Teach back method used.  Expect good compliance.  Body mass index is 34.97 kg/m. Pt meets criteria for Obese based on current BMI.  Pt reports that he is a vegetarian at home, however does still eat chicken and eggs. Pt reports that he eats eggs and toast for breakfast and  then does have plant-based meats as well. Pt reports that he drinks a lot of sweet tea, but plans to eliminate that now with his new DM.   Current diet order is Carb Modified, patient is consuming approximately 80-100% of meals at this time. Labs and medications reviewed.   RD will place referral for outpatient diabetes education.  No further nutrition interventions warranted at this time.  If additional nutrition issues arise, please re-consult RD.    Karie Mainland, RD, LDN Clinical Dietitian See [facility] for contact information.

## 2021-03-09 IMAGING — RF DG C-ARM 1-60 MIN
1 series · 8 of 8 positions shown · non-contrast
Comparison: None.

FLUOROSCOPY TIME:  0 MINUTES 27.1 SECONDS; 4.698 MGY; 5 ACQUIRED
IMAGES

CLINICAL DATA: Status post left total hip replacement

EXAM:
OPERATIVE LEFT HIP (WITH PELVIS IF PERFORMED) 2 VIEWS
TECHNIQUE: Fluoroscopic spot image(s) were submitted for interpretation
post-operatively.

[Series 1: unknown protocol · 0.20mm/px · 8 of 8 slices shown]
[im 1/8]
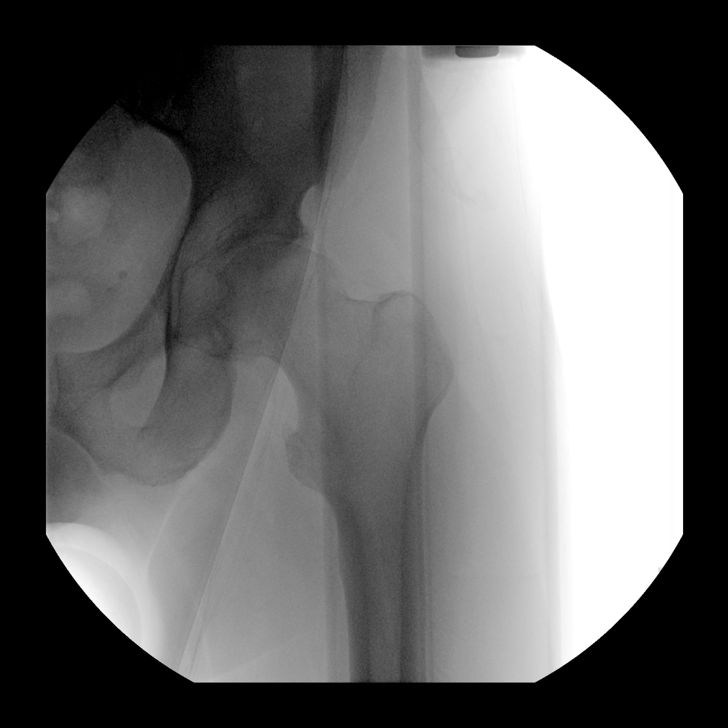
[im 2/8]
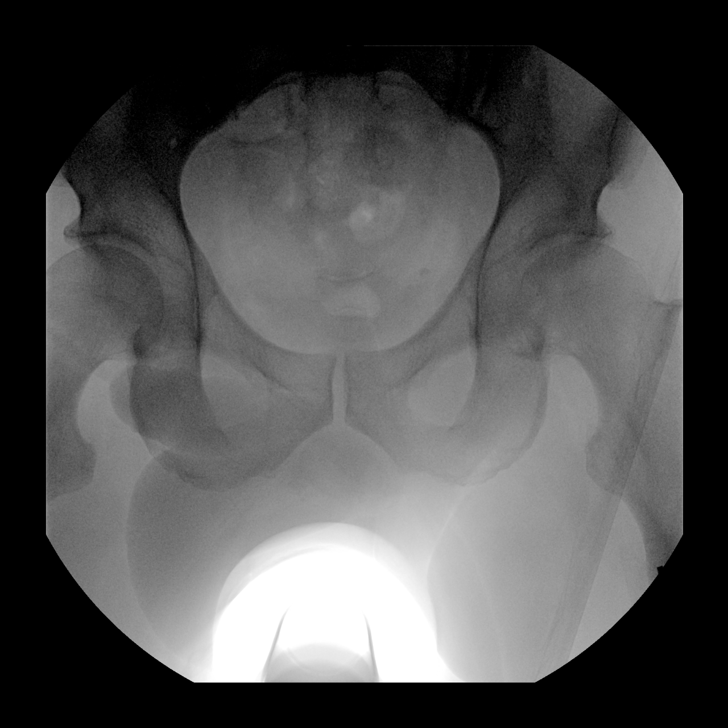
[im 3/8]
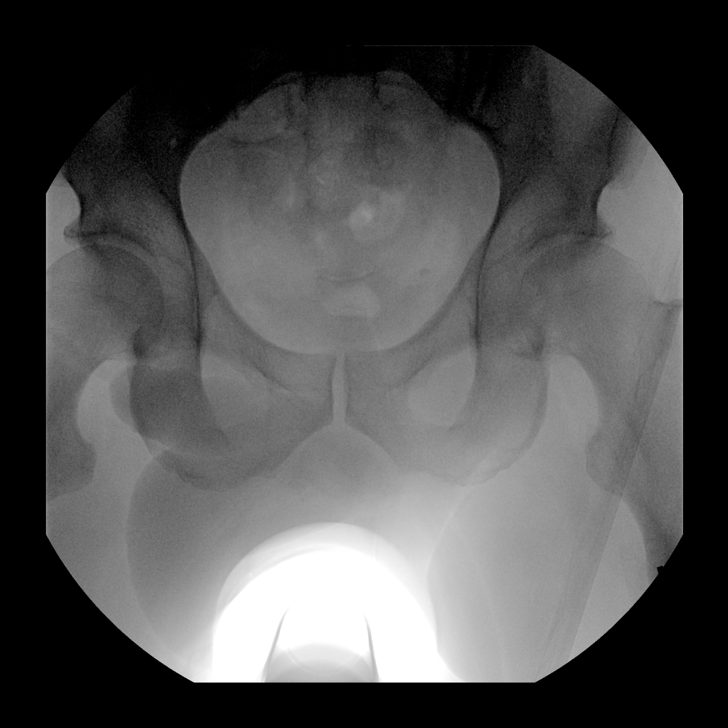
[im 4/8]
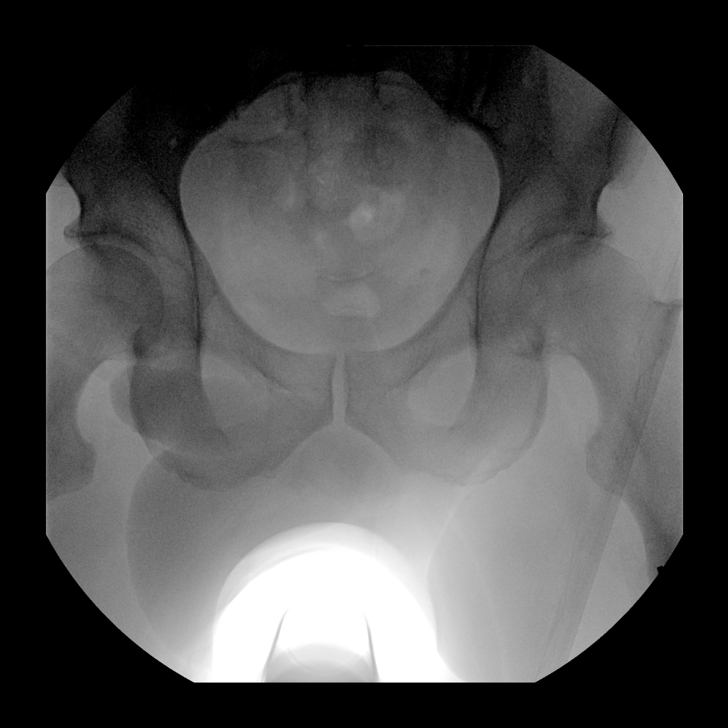
[im 5/8]
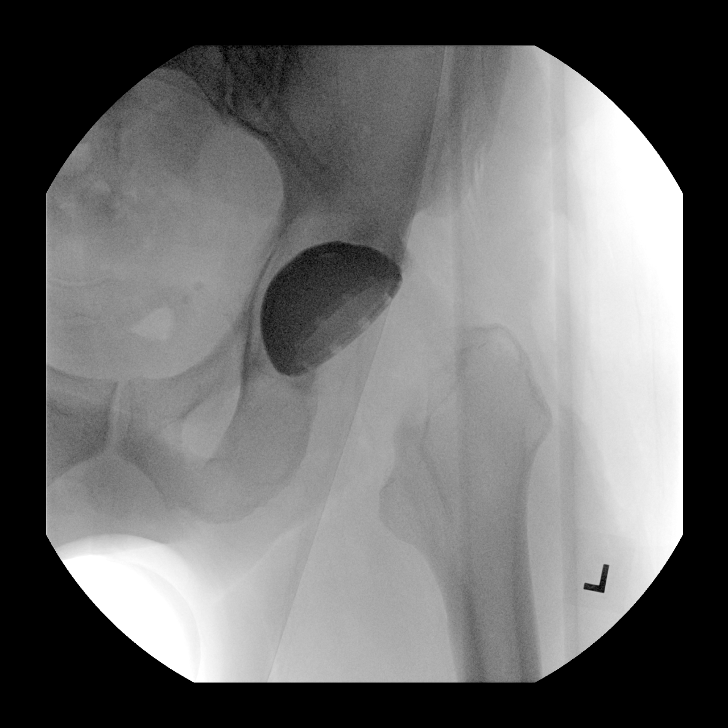
[im 6/8]
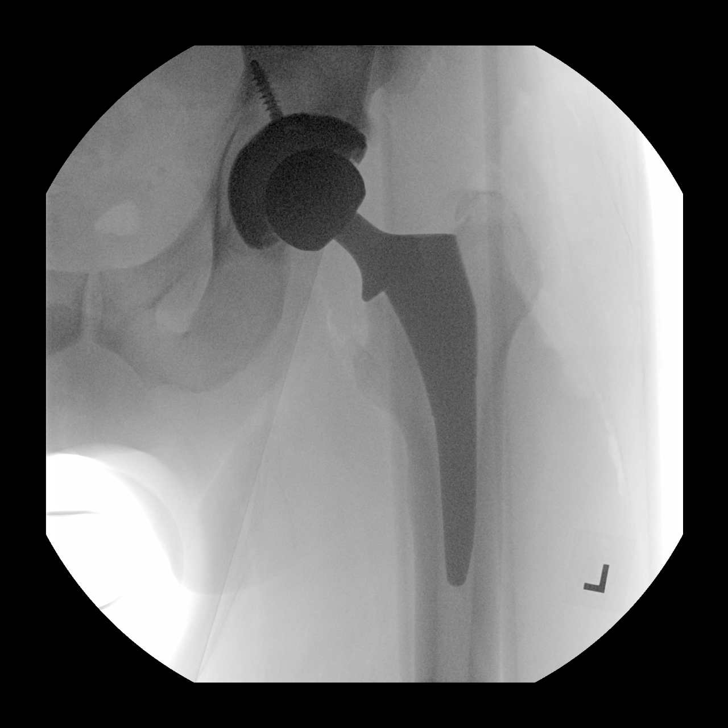
[im 7/8]
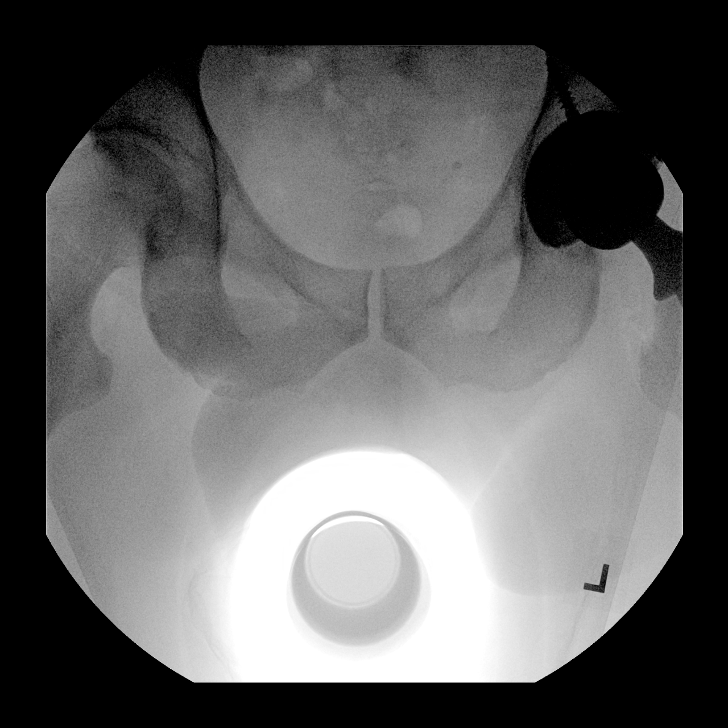
[im 8/8]
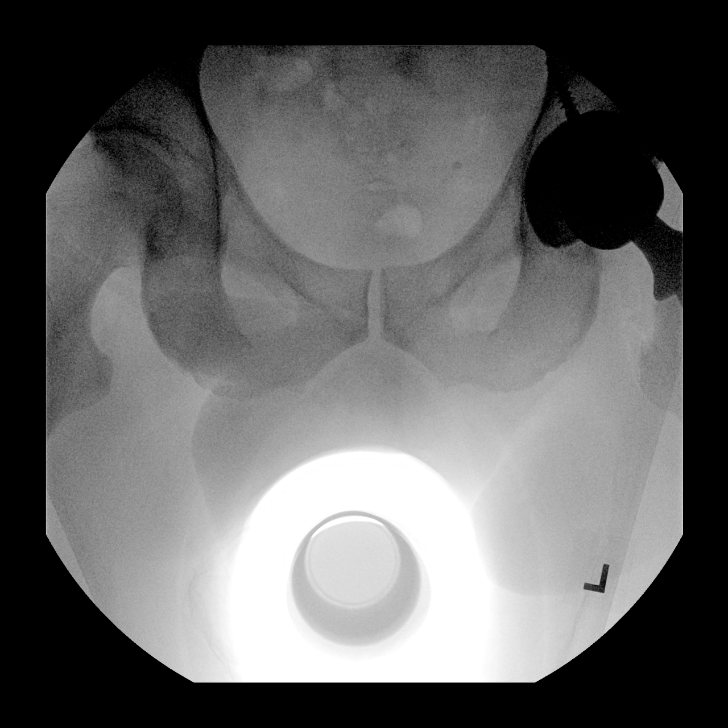

[8 of 8 positions shown; findings below may reference images not displayed]

FINDINGS: Initially, frontal views of each hip joint were performed
preoperatively. Moderate osteoarthritic change noted on the left. No
fracture or dislocation.

Subsequent images show placement total hip replacement on the left.
Prosthetic components on the left appear well-seated on frontal
view. No fracture or dislocation. Right hip joint appears
essentially normal.
IMPRESSION: Status post total hip replacement on the left with prosthetic
components well-seated on frontal view. No appreciable fracture or
dislocation. Essentially normal appearing right hip joint.

## 2021-03-09 IMAGING — RF DG HIP (WITH PELVIS) OPERATIVE*L*
1 series · 8 of 8 positions shown · non-contrast
Comparison: None.

FLUOROSCOPY TIME:  0 MINUTES 27.1 SECONDS; 4.698 MGY; 5 ACQUIRED
IMAGES

CLINICAL DATA: Status post left total hip replacement

EXAM:
OPERATIVE LEFT HIP (WITH PELVIS IF PERFORMED) 2 VIEWS
TECHNIQUE: Fluoroscopic spot image(s) were submitted for interpretation
post-operatively.

[Series 1: unknown protocol · 0.20mm/px · 8 of 8 slices shown]
[im 1/8]
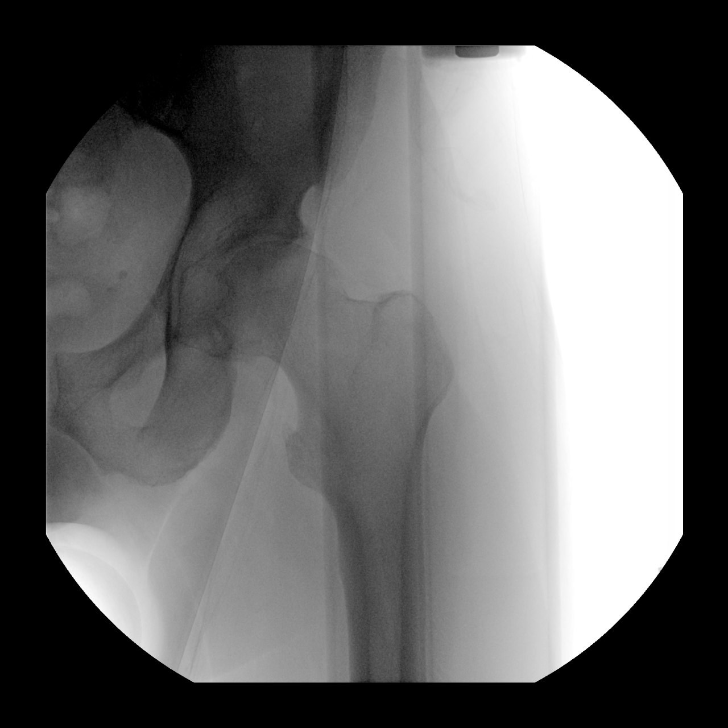
[im 2/8]
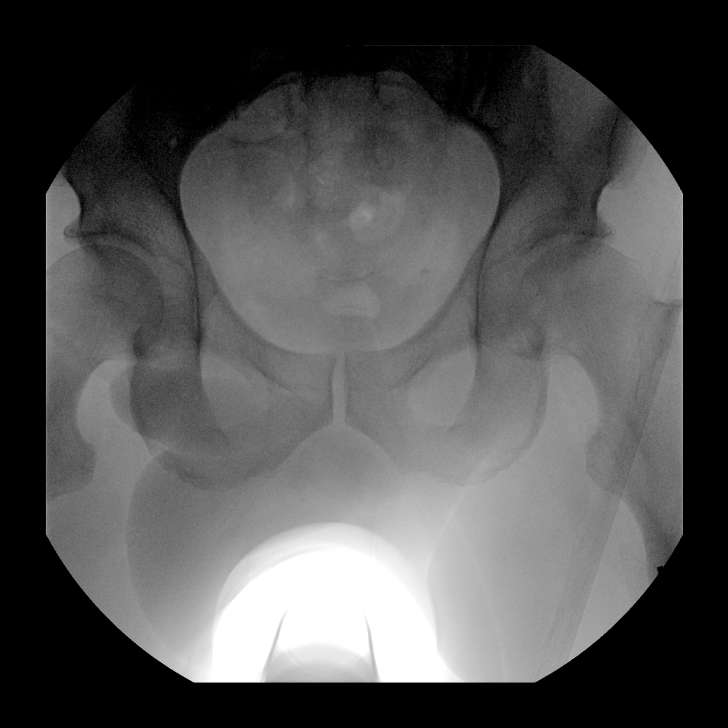
[im 3/8]
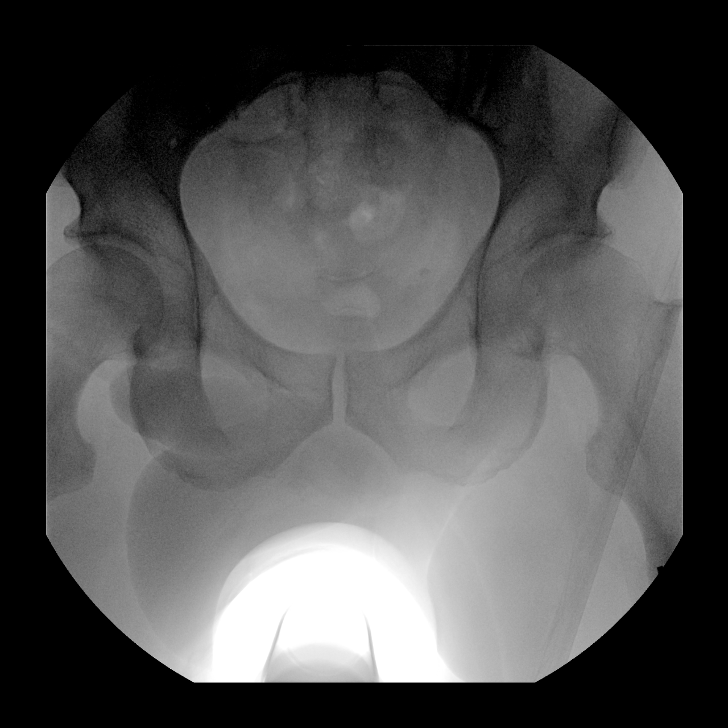
[im 4/8]
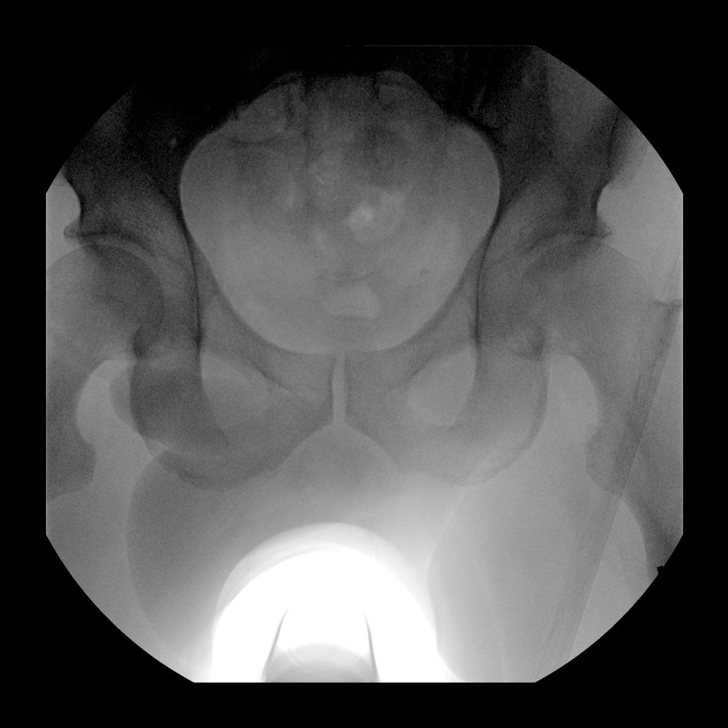
[im 5/8]
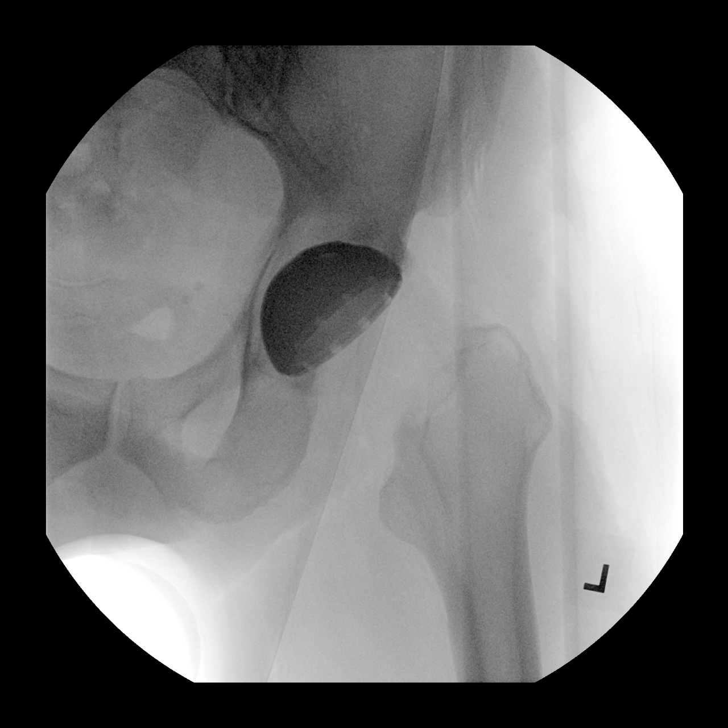
[im 6/8]
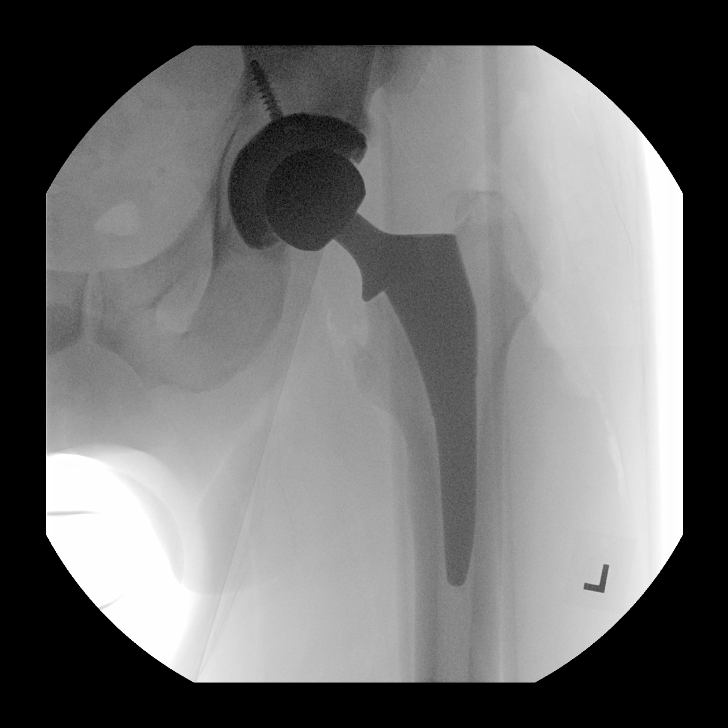
[im 7/8]
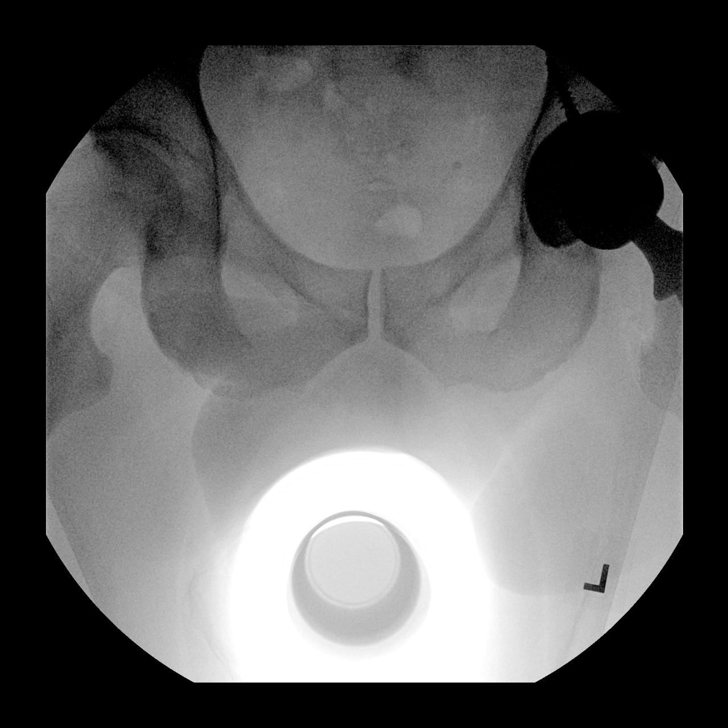
[im 8/8]
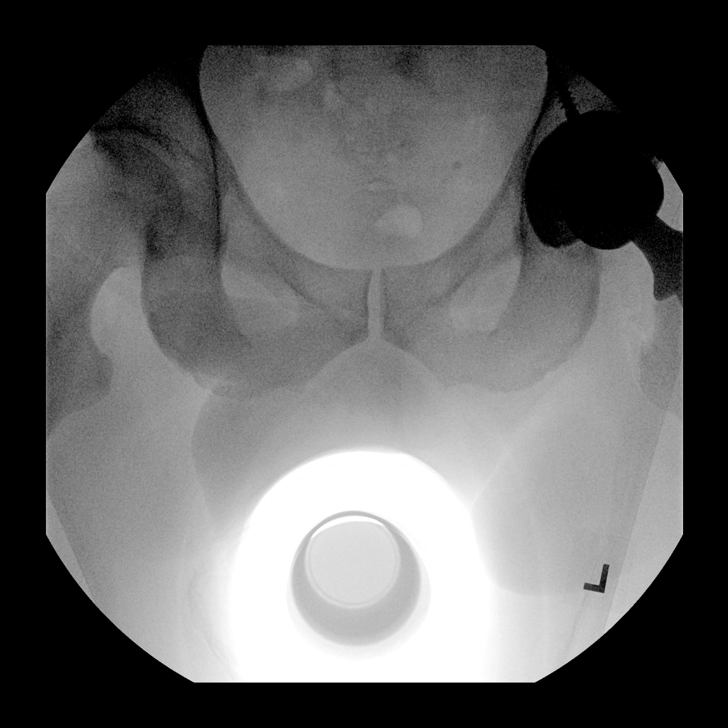

[8 of 8 positions shown; findings below may reference images not displayed]

FINDINGS: Initially, frontal views of each hip joint were performed
preoperatively. Moderate osteoarthritic change noted on the left. No
fracture or dislocation.

Subsequent images show placement total hip replacement on the left.
Prosthetic components on the left appear well-seated on frontal
view. No fracture or dislocation. Right hip joint appears
essentially normal.
IMPRESSION: Status post total hip replacement on the left with prosthetic
components well-seated on frontal view. No appreciable fracture or
dislocation. Essentially normal appearing right hip joint.

## 2021-03-15 NOTE — Addendum Note (Signed)
Encounter addended by: Andee Lineman A on: 03/15/2021 2:45 PM  Actions taken: Imaging Exam ended

## 2021-03-22 ENCOUNTER — Ambulatory Visit (INDEPENDENT_AMBULATORY_CARE_PROVIDER_SITE_OTHER): Payer: PRIVATE HEALTH INSURANCE | Admitting: Student

## 2021-03-22 ENCOUNTER — Encounter: Payer: Self-pay | Admitting: Student

## 2021-03-22 ENCOUNTER — Other Ambulatory Visit: Payer: Self-pay

## 2021-03-22 VITALS — BP 130/76 | HR 72 | Ht 71.0 in | Wt 246.0 lb

## 2021-03-22 DIAGNOSIS — I472 Ventricular tachycardia, unspecified: Secondary | ICD-10-CM | POA: Diagnosis not present

## 2021-03-22 DIAGNOSIS — E119 Type 2 diabetes mellitus without complications: Secondary | ICD-10-CM

## 2021-03-22 LAB — BASIC METABOLIC PANEL
BUN/Creatinine Ratio: 8 — ABNORMAL LOW (ref 9–20)
BUN: 8 mg/dL (ref 6–24)
CO2: 26 mmol/L (ref 20–29)
Calcium: 10.2 mg/dL (ref 8.7–10.2)
Chloride: 101 mmol/L (ref 96–106)
Creatinine, Ser: 1.03 mg/dL (ref 0.76–1.27)
Glucose: 82 mg/dL (ref 70–99)
Potassium: 4.2 mmol/L (ref 3.5–5.2)
Sodium: 142 mmol/L (ref 134–144)
eGFR: 88 mL/min/{1.73_m2} (ref >59)

## 2021-03-22 LAB — MAGNESIUM: Magnesium: 1.8 mg/dL (ref 1.6–2.3)
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
## 2021-03-22 NOTE — Patient Instructions (Signed)
Medication Instructions:  ?Your physician recommends that you continue on your current medications as directed. Please refer to the Current Medication list given to you today. ? ?*If you need a refill on your cardiac medications before your next appointment, please call your pharmacy* ? ? ?Lab Work: ?TODAY: BMET, [place] ? ?If you have labs (blood work) drawn today and your tests are completely normal, you will receive your results only by: ?MyChart Message (if you have MyChart) OR ?A paper copy in the mail ?If you have any lab test that is abnormal or we need to change your treatment, we will call you to review the results. ? ? ?Follow-Up: ?At [facility], you and your health needs are our priority.  As part of our continuing mission to provide you with exceptional heart care, we have created designated Provider Care Teams.  These Care Teams include your primary Cardiologist (physician) and Advanced Practice Providers (APPs -  Physician Assistants and Nurse Practitioners) who all work together to provide you with the care you need, when you need it. ? ?We recommend signing up for the patient portal called "MyChart".  Sign up information is provided on this After Visit Summary.  MyChart is used to connect with patients for Virtual Visits (Telemedicine).  Patients are able to view lab/test results, encounter notes, upcoming appointments, etc.  Non-urgent messages can be sent to your provider as well.   ?To learn more about what you can do with MyChart, go to [email or web address].   ? ?Your next appointment:   ?3 month(s) ? ?The format for your next appointment:   ?In Person ? ?Provider:   ?Vickie Epley, MD  ?

## 2021-04-10 ENCOUNTER — Encounter: Payer: PRIVATE HEALTH INSURANCE | Attending: Internal Medicine | Admitting: Nutrition

## 2021-04-10 ENCOUNTER — Other Ambulatory Visit: Payer: Self-pay

## 2021-04-10 VITALS — Ht 71.5 in | Wt 241.0 lb

## 2021-04-10 DIAGNOSIS — E118 Type 2 diabetes mellitus with unspecified complications: Secondary | ICD-10-CM | POA: Insufficient documentation

## 2021-04-10 DIAGNOSIS — I252 Old myocardial infarction: Secondary | ICD-10-CM

## 2021-04-10 DIAGNOSIS — Z8249 Family history of ischemic heart disease and other diseases of the circulatory system: Secondary | ICD-10-CM | POA: Diagnosis present

## 2021-04-10 NOTE — Progress Notes (Signed)
Medical Nutrition Therapy  ?Appointment Start time:  1530  Appointment End time:  1630 ? ?Primary concerns today: Dm Type 2, Obesity, h/o Heart attack  ?Referral diagnosis: E11.8, e66.9,  ?Preferred learning style: hear, auditory  ?Learning readiness: Change in progress  ? ? ?NUTRITION ASSESSMENT  ?35 y old bmale with DM Type 2. Wants to improve his DM and lose weight and learn how to eat better. ?Testing Blood sugars 1-2 times per day ?Lost 13 lbs since finding out about his A1C. ?Has cut out a lot of bread and fast foods. ? ?Anthropometrics  ?Wt Readings from Last 3 Encounters:  ?03/22/21 246 lb (111.6 kg)  ?02/22/21 250 lb 11.2 oz (113.7 kg)  ?08/24/19 235 lb (106.6 kg)  ? ?Ht Readings from Last 3 Encounters:  ?03/22/21 5\' 11"  (1.803 m)  ?02/19/21 5\' 11"  (1.803 m)  ?08/24/19 5' 10.5" (1.791 m)  ? ?There is no height or weight on file to calculate BMI. ?@BMIFA @ ?Facility age limit for growth percentiles is 20 years. ?Facility age limit for growth percentiles is 20 years. ?  ? ?Clinical ?Medical Hx: See chart ?Medications: Metformin 500 mg once a day, Ozempic .25 and will increase  ?119-130's. ?Had a hip replacement a year and half ago and was sedetary,  Had gained about 30 lbs. ?Has been vegetarian for 10 yrs. Has a history of gout. ? ?Labs:  ?Lab Results  ?Component Value Date  ? HGBA1C 9.9 (H) 02/19/2021  ? ?CMP Latest Ref Rng & Units 03/22/2021 02/21/2021 02/19/2021  ?Glucose 70 - 99 mg/dL 82 05/22/2021) 04/21/2021)  ?BUN 6 - 24 mg/dL 8 8 10   ?Creatinine 0.76 - 1.27 mg/dL 02/21/2021 921(J 941(D  ?Sodium 134 - 144 mmol/L 142 136 133(L)  ?Potassium 3.5 - 5.2 mmol/L 4.2 3.7 3.7  ?Chloride 96 - 106 mmol/L 101 104 100  ?CO2 20 - 29 mmol/L 26 23 24   ?Calcium 8.7 - 10.2 mg/dL 9.1 9.2  ?Total Protein 6.5 - 8.1 g/dL - - -  ?Total Bilirubin 0.3 - 1.2 mg/dL - - -  ?Alkaline Phos 38 - 126 U/L - - -  ?AST 15 - 41 U/L - - -  ?ALT 0 - 44 U/L - - -  ?@BMIFA @ ?Facility age limit for growth percentiles is 20 years. ?Facility age limit for growth  percentiles is 20 years. ? ?Notable Signs/Symptoms: none ? ?Lifestyle & Dietary Hx ?Works. Had hip replacement and feels weight gain caused his BS to go up. ? ?Estimated daily fluid intake: 60 oz ?Supplements: MVI ?Sleep: varies ?Stress / self-care: His health ?Current average weekly physical activity:  ? ?24-Hr Dietary Recall ?Eats 2-3 meals per day. Tends to eat late at night ? ?Estimated Energy Needs ?Calories: 1800 ?Carbohydrate: 200g ?Protein: 135g ?Fat: 50g ? ? ?NUTRITION DIAGNOSIS  ?NB-1.1 Food and nutrition-related knowledge deficit As related to Diabetes Type 2.  As evidenced by A1C 9.9%. ? ? ?NUTRITION INTERVENTION  ?Nutrition education (E-1) on the following topics:  ?Nutrition and Diabetes education provided on My Plate, CHO counting, meal planning, portion sizes, timing of meals, avoiding snacks between meals unless having a low blood sugar, target ranges for A1C and blood sugars, signs/symptoms and treatment of hyper/hypoglycemia, monitoring blood sugars, taking medications as prescribed, benefits of exercising 30 minutes per day and prevention of complications of DM. ?  ?Lifestyle Medicine ?- Whole Food, Plant Predominant Nutrition is highly recommended: Eat Plenty of vegetables, Mushrooms, fruits, Legumes, Whole Grains, Nuts, seeds in lieu of processed meats, processed snacks/pastries  red meat, poultry, eggs.  ?  ?-It is better to avoid simple carbohydrates including: Cakes, Sweet Desserts, Ice Cream, Soda (diet and regular), Sweet Tea, Candies, Chips, Cookies, Store Bought Juices, Alcohol in Excess of  1-2 drinks a day, Lemonade,  Artificial Sweeteners, Doughnuts, Coffee Creamers, "Sugar-free" Products, etc, etc.  This is not a complete list..... ? ?Exercise: If you are able: 30 -60 minutes a day ,4 days a week, or 150 minutes a week.  The longer the better.  Combine stretch, strength, and aerobic activities.  If you were told in the past that you have high risk for cardiovascular diseases, you may  seek evaluation by your heart doctor prior to initiating moderate to intense exercise programs ? ?Handouts Provided Include  ?Lifestyle Medicine ?Plant based meal plan ?Calorie density of foods ?Meal Plan Card ? ?Learning Style & Readiness for Change ?Teaching method utilized: Visual & Auditory  ?Demonstrated degree of understanding via: Teach Back  ?Barriers to learning/adherence to lifestyle change: none ? ?Goals Established by Pt ?Goals ? ?Eat three meals per day at times discussed. ?Don't skip meals ?Focus on whole foods, plant based. ?Increase fresh fruits, vegetables and whole grains. ?Drink only water ?Exercise 150 minutes a week ?Cut out junk food. ?Don't eat past 7 pm. ?Get A1C down to 7% ?Lose 1 lb per week. ? ? ?MONITORING & EVALUATION ?Dietary intake, weekly physical activity, and blood sugars in 1 month. ? ?Next Steps  ?Patient is to work on meal planning and more plant based foods. ? ?

## 2021-05-03 ENCOUNTER — Encounter: Payer: Self-pay | Admitting: Nutrition

## 2021-05-03 NOTE — Patient Instructions (Signed)
Goals ? ?Eat three meals per day at times discussed. ?Don't skip meals ?Focus on whole foods, plant based. ?Increase fresh fruits, vegetables and whole grains. ?Drink only water ?Exercise 150 minutes a week ?Cut out junk food. ?Don't eat past 7 pm. ?Get A1C down to 7% ?Lose 1 lb per week. ?
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
## 2021-05-30 ENCOUNTER — Encounter: Payer: Self-pay | Admitting: Cardiology

## 2021-05-30 ENCOUNTER — Ambulatory Visit (INDEPENDENT_AMBULATORY_CARE_PROVIDER_SITE_OTHER): Payer: PRIVATE HEALTH INSURANCE | Admitting: Cardiology

## 2021-05-30 VITALS — BP 112/70 | HR 73 | Ht 70.5 in | Wt 237.0 lb

## 2021-05-30 DIAGNOSIS — I472 Ventricular tachycardia, unspecified: Secondary | ICD-10-CM

## 2021-05-30 NOTE — Patient Instructions (Signed)
Medication Instructions:  ?Your physician recommends that you continue on your current medications as directed. Please refer to the Current Medication list given to you today. ? ?*If you need a refill on your cardiac medications before your next appointment, please call your pharmacy* ? ? ?Lab Work: ?None ordered ? ? ?Testing/Procedures: ?None ordered ? ? ?Follow-Up: ?At [facility], you and your health needs are our priority.  As part of our continuing mission to provide you with exceptional heart care, we have created designated Provider Care Teams.  These Care Teams include your primary Cardiologist (physician) and Advanced Practice Providers (APPs -  Physician Assistants and Nurse Practitioners) who all work together to provide you with the care you need, when you need it. ? ?We recommend signing up for the patient portal called "MyChart".  Sign up information is provided on this After Visit Summary.  MyChart is used to connect with patients for Virtual Visits (Telemedicine).  Patients are able to view lab/test results, encounter notes, upcoming appointments, etc.  Non-urgent messages can be sent to your provider as well.   ?To learn more about what you can do with MyChart, go to [email or web address].   ? ?Your next appointment:   ?12 month(s) ? ?The format for your next appointment:   ?In Person ? ?Provider:   ?You will see one of the following Advanced Practice Providers on your designated Care Team:   ?Francis Dowse, PA-C ?Casimiro Needle "Mardelle Matte" Elaine, PA-C ? ?{ ? ?Thank you for choosing [facility]!! ? ? ? ? ?Important Information About Sugar ? ? ? ? ? ? ? ? ?  ?

## 2021-05-30 NOTE — Progress Notes (Signed)
?Electrophysiology Office Follow up Visit Note:   ? ?Date:  05/30/2021  ? ?ID:  Derrick Barr, DOB 11-12-1970, MRN [id number] ? ?PCP:  Celene Squibb, MD  ?[facility] Cardiologist:  Carlyle Dolly, MD  ?[facility] Electrophysiologist:  Vickie Epley, MD  ? ? ?Interval History:   ? ?Derrick Barr is a 51 y.o. male who presents for a follow up visit. I met Derrick Barr 02/20/2021 when he presented with VT. His VT had a steeply inferior axis and was consistent with outflow tract VT. We planned treatment with BB and if there was recurrence planned EP study. ? ?Today, he reports feeling pretty good since leaving the hospital. A few times he has felt short of breath, but he denies any chest pains or palpitations. ? ?Typically he is active. He paints cars for a living. ? ?He denies any peripheral edema. No lightheadedness, headaches, syncope, orthopnea, or PND. ? ? ?  ? ?Past Medical History:  ?Diagnosis Date  ? Anxiety   ? Chronic gout followed by pcp  ? 08-11-2019 per pt last episode left knee 06/ 2021  ? Depression   ? Dysuria   ? Hematuria   ? History of kidney stones   ? OA (osteoarthritis)   ? knees, hips  ? Prediabetes   ? Renal calculus, bilateral   ? Urgency of urination   ? ? ?Past Surgical History:  ?Procedure Laterality Date  ? CYSTOSCOPY WITH RETROGRADE PYELOGRAM, URETEROSCOPY AND STENT PLACEMENT Bilateral 07/31/2019  ? Procedure: CYSTOSCOPY WITH RETROGRADE PYELOGRAM, URETEROSCOPY AND STENT PLACEMENT;  Surgeon: Alexis Frock, MD;  Location: [facility];  Service: Urology;  Laterality: Bilateral;  ? CYSTOSCOPY WITH RETROGRADE PYELOGRAM, URETEROSCOPY AND STENT PLACEMENT Bilateral 08/18/2019  ? Procedure: CYSTOSCOPY WITH RETROGRADE PYELOGRAM, URETEROSCOPY AND STENT PLACEMENT;  Surgeon: Alexis Frock, MD;  Location: [facility];  Service: Urology;  Laterality: Bilateral;  75 MINS  ? EYE SURGERY Left age 60  ? BB 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  ? HOLMIUM LASER APPLICATION Bilateral 5/79/0383  ?  Procedure: HOLMIUM LASER APPLICATION;  Surgeon: Alexis Frock, MD;  Location: [facility];  Service: Urology;  Laterality: Bilateral;  ? LEFT HEART CATH AND CORONARY ANGIOGRAPHY N/A 02/20/2021  ? Procedure: LEFT HEART CATH AND CORONARY ANGIOGRAPHY;  Surgeon: Jettie Booze, MD;  Location: [place] [facility];  Service: Cardiovascular;  Laterality: N/A;  ? TOTAL HIP ARTHROPLASTY Left 06/28/2019  ? Procedure: LEFT TOTAL HIP ARTHROPLASTY ANTERIOR APPROACH;  Surgeon: Leandrew Koyanagi, MD;  Location: [place];  Service: Orthopedics;  Laterality: Left;  ? WISDOM TOOTH EXTRACTION    ? ? ?Current Medications: ?Current Meds  ?Medication Sig  ? allopurinol (ZYLOPRIM) 100 MG tablet Take 1 tablet by mouth daily as needed (gout).  ? atorvastatin (LIPITOR) 80 MG tablet Take 1 tablet (80 mg total) by mouth daily at 6 PM.  ? diclofenac Sodium (VOLTAREN) 1 % GEL Apply 2 g topically 2 (two) times daily as needed (joint pain).  ? metFORMIN (GLUCOPHAGE-XR) 500 MG 24 hr tablet Take 500 mg by mouth daily.  ? metoprolol succinate (TOPROL-XL) 25 MG 24 hr tablet Take 3 tablets (75 mg total) by mouth 2 (two) times daily.  ? sertraline (ZOLOFT) 50 MG tablet Take 50 mg by mouth daily.  ?  ? ?Allergies:   Augmentin [amoxicillin-pot clavulanate]  ? ?Social History  ? ?Socioeconomic History  ? Marital status: Divorced  ?  Spouse name: Not on file  ? Number of children:  Not on file  ? Years of education: Not on file  ? Highest education level: Not on file  ?Occupational History  ? Not on file  ?Tobacco Use  ? Smoking status: Never  ? Smokeless tobacco: Never  ?Vaping Use  ? Vaping Use: Former  ? Quit date: 08/10/2017  ?Substance and Sexual Activity  ? Alcohol use: No  ?  Alcohol/week: 0.0 standard drinks  ? Drug use: Never  ? Sexual activity: Yes  ?Other Topics Concern  ? Not on file  ?Social History Narrative  ? Not on file  ? ?Social Determinants of Health  ? ?Financial Resource Strain: Not on file  ?Food Insecurity: Not on file   ?Transportation Needs: Not on file  ?Physical Activity: Not on file  ?Stress: Not on file  ?Social Connections: Not on file  ?  ? ?Family History: ?The patient's family history includes Heart attack (age of onset: 3) in his father. ? ?ROS:   ?Please see the history of present illness.    ?(+) Shortness of breath ?All other systems reviewed and are negative. ? ?EKGs/Labs/Other Studies Reviewed:   ? ?The following studies were reviewed today: ? ? ?03/15/2021 [facility] ?HR 42 - 176 bpm, average 72 bpm. ?1 SVT lasting 5 beats. ?Rare supraventricular and ventricular ectopy. ?No sustained arrhythmias. ? ? ?02/21/2021 Cardiac MRI ?IMPRESSION: ?1. Normal LV size and wall thickness with mild systolic dysfunction ?(EF 49%) ? 2.  Normal RV size with low normal systolic function (EF 16%) ? 3.  No late gadolinium enhancement to suggest myocardial scar ? 4.  Dilated main pulmonary artery measuring 14m ?  ? ?02/20/2021   Left Heart Cath ?  Mid LAD lesion is 25% stenosed. ?  Mid Cx lesion is 40% stenosed. ?  The left ventricular systolic function is normal. ?  LV end diastolic pressure is normal. ?  The left ventricular ejection fraction is 55-65% by visual estimate. ?  There is no aortic valve stenosis. ?  ?Mild, diffuse atherosclerosis noted throughout the coronary vessels.  ?  ?He needs aggressive risk factor modification including diabetes management, lipid lowering therapy and healthy lifestyle including healthy diet and regular exercise.  ?  ?Consider cardiac MRI to further evaluate arrhythmia.  ? ? ?02/20/2021   Echo ? 1. Left ventricular ejection fraction, by estimation, is 60 to 65%. The  ?left ventricle has normal function. The left ventricle has no regional  ?wall motion abnormalities. Left ventricular diastolic parameters are  ?indeterminate.  ? 2. Right ventricular systolic function is normal. The right ventricular  ?size is normal.  ? 3. The mitral valve is normal in structure. No evidence of mitral valve  ?regurgitation. No  evidence of mitral stenosis.  ? 4. The aortic valve is tricuspid. Aortic valve regurgitation is not  ?visualized. No aortic stenosis is present.  ? 5. IVC is small suggesting low RA pressure and hypovolemia.  ? ? ?EKG:  EKG is personally reviewed. ?05/30/2021: Sinus rhythm.  No PVCs or VT.  Intervals are normal. ? ?Recent Labs: ?02/22/2021: Hemoglobin 15.1; Platelets 218 ?03/22/2021: BUN 8; Creatinine, Ser 1.03; Magnesium 1.8; Potassium 4.2; Sodium 142  ? ?Recent Lipid Panel ?   ?Component Value Date/Time  ? CHOL 181 02/21/2021 0454  ? TRIG 258 (H) 02/21/2021 0454  ? HDL 27 (L) 02/21/2021 0454  ? CHOLHDL 6.7 02/21/2021 0454  ? VLDL 52 (H) 02/21/2021 0454  ? LDLCALC 102 (H) 02/21/2021 0454  ? ? ?Physical Exam:   ? ?VS:  BP  112/70   Pulse 73   Ht 5' 10.5" (1.791 m)   Wt 237 lb (107.5 kg)   SpO2 92%   BMI 33.53 kg/m?    ? ?Wt Readings from Last 3 Encounters:  ?05/30/21 237 lb (107.5 kg)  ?04/10/21 241 lb (109.3 kg)  ?03/22/21 246 lb (111.6 kg)  ?  ? ?GEN: Well nourished, well developed in no acute distress ?HEENT: Normal ?NECK: No JVD; No carotid bruits ?LYMPHATICS: No lymphadenopathy ?CARDIAC: RRR, no murmurs, rubs, gallops ?RESPIRATORY:  Clear to auscultation without rales, wheezing or rhonchi  ?ABDOMEN: Soft, non-tender, non-distended ?MUSCULOSKELETAL:  No edema; No deformity  ?SKIN: Warm and dry ?NEUROLOGIC:  Alert and oriented x 3 ?PSYCHIATRIC:  Normal affect  ? ? ? ?  ? ?ASSESSMENT:   ? ?1. Ventricular tachycardia ([place])   ? ?PLAN:   ? ?In order of problems listed above: ? ?#PVCs/VT ?Resolved on metoprolol.  Continue metoprolol 75 mg by mouth twice daily.  We will have him follow-up about 1 year from now or sooner as needed.  If he has breakthrough episodes despite treatment with metoprolol twice daily, would favor EP study and possible ablation given outflow tract origin of his VT. ? ? ?Follow-up in 1 year with APP. ? ?Medication Adjustments/Labs and Tests Ordered: ?Current medicines are reviewed at length with  the patient today.  Concerns regarding medicines are outlined above.  ? ?Orders Placed This Encounter  ?Procedures  ? EKG 12-Lead  ? ?No orders of the defined types were placed in this encounter. ? ?I,Mathew S

## 2021-06-07 ENCOUNTER — Encounter (INDEPENDENT_AMBULATORY_CARE_PROVIDER_SITE_OTHER): Payer: Self-pay | Admitting: *Deleted

## 2021-06-17 ENCOUNTER — Other Ambulatory Visit: Payer: Self-pay | Admitting: Physician Assistant

## 2021-06-27 ENCOUNTER — Ambulatory Visit: Payer: PRIVATE HEALTH INSURANCE | Admitting: Nutrition

## 2021-07-05 ENCOUNTER — Other Ambulatory Visit: Payer: Self-pay | Admitting: Physician Assistant

## 2021-07-05 ENCOUNTER — Ambulatory Visit: Payer: PRIVATE HEALTH INSURANCE | Admitting: Cardiology

## 2021-08-08 ENCOUNTER — Encounter: Payer: PRIVATE HEALTH INSURANCE | Attending: Internal Medicine | Admitting: Nutrition

## 2021-08-08 VITALS — Ht 71.0 in | Wt 239.2 lb

## 2021-08-08 DIAGNOSIS — R739 Hyperglycemia, unspecified: Secondary | ICD-10-CM | POA: Diagnosis not present

## 2021-08-08 DIAGNOSIS — E118 Type 2 diabetes mellitus with unspecified complications: Secondary | ICD-10-CM

## 2021-08-08 DIAGNOSIS — E782 Mixed hyperlipidemia: Secondary | ICD-10-CM

## 2021-08-08 DIAGNOSIS — Z713 Dietary counseling and surveillance: Secondary | ICD-10-CM | POA: Diagnosis not present

## 2021-08-08 DIAGNOSIS — I252 Old myocardial infarction: Secondary | ICD-10-CM

## 2021-08-08 DIAGNOSIS — I10 Essential (primary) hypertension: Secondary | ICD-10-CM

## 2021-08-08 NOTE — Patient Instructions (Addendum)
Goals  Increase walking on weekends Lose 2-3 lbs per month Get A1C down to 5.7% or below.

## 2021-08-08 NOTE — Progress Notes (Signed)
Medical Nutrition Therapy  Appointment Start time:  1530  Appointment End time:  1600  Primary concerns today: Dm Type 2, Obesity, h/o Heart attack  Referral diagnosis: E11.8, e66.9,  Preferred learning style: hear, auditory  Learning readiness: Change in progress    NUTRITION ASSESSMENT  6 y old male with DM Type 2. Wants to improve his DM and lose weight and learn how to eat better.  Changes made:   Lost 2 lbs  Has been eating 100% vegetarian. No has no more gout pain and his joints feel 100% better. He hasn't needed his gout medicine in 3-4 months since he changed his eating habits. He notes he feels so much better and has a lot more energy. He didn't realize the impact of processed foods on joint pain. He is doing a lot better with food choices and eating foods that are plant based.  FBS: 80-110's  lunch time: 120's Bedtime: 120-130's. AVG BS reading 127  Has the [place]. Had a few low blood sugars when at the beach. BS was 69.  He notes his A1C at last MD was 6%. He has been walking. Works Occupational hygienist  cars. Walks on the weekend.  Making excellent progress.  Anthropometrics  Wt Readings from Last 3 Encounters:  05/30/21 237 lb (107.5 kg)  04/10/21 241 lb (109.3 kg)  03/22/21 246 lb (111.6 kg)   Ht Readings from Last 3 Encounters:  05/30/21 5' 10.5" (1.791 m)  04/10/21 5' 11.5" (1.816 m)  03/22/21 5\' 11"  (1.803 m)   There is no height or weight on file to calculate BMI. @BMIFA @ Facility age limit for growth %iles is 20 years. Facility age limit for growth %iles is 20 years.    Clinical Medical Hx: See chart Medications: Metformin 500 mg once a day,   Had a hip replacement a year and half ago and was sedetary,  Had gained about 30 lbs. Has been vegetarian for 10 yrs. Has a history of gout.  Labs:  Lab Results  Component Value Date   HGBA1C 9.9 (H) 02/19/2021      Latest Ref Rng & Units 03/22/2021   10:01 AM 02/21/2021    4:54 AM 02/19/2021    9:41 PM   CMP  Glucose 70 - 99 mg/dL 82  04/21/2021  02/21/2021   BUN 6 - 24 mg/dL 8  8  10    Creatinine 0.76 - 1.27 mg/dL 128  786    Sodium 134 - 144 mmol/L 142  136  133   Potassium 3.5 - 5.2 mmol/L 4.2  3.7  3.7   Chloride 96 - 106 mmol/L 101  104  100   CO2 20 - 29 mmol/L 26  23  24    Calcium 8.7 - 10.2 mg/dL 7.67  9.1  9.2   @BMIFA @ Facility age limit for growth %iles is 20 years. Facility age limit for growth %iles is 20 years.  Notable Signs/Symptoms: none  Lifestyle & Dietary Hx Works. Had hip replacement and feels weight gain caused his BS to go up.  Estimated daily fluid intake: 60 oz Supplements: MVI Sleep: varies Stress / self-care: His health Current average weekly physical activity:   24-Hr Dietary Recall Eats 2-3 meals per day. Tends to eat late at night  Estimated Energy Needs Calories: 1800 Carbohydrate: 200g Protein: 135g Fat: 50g   NUTRITION DIAGNOSIS  NB-1.1 Food and nutrition-related knowledge deficit As related to Diabetes Type 2.  As evidenced by A1C 9.9%.   NUTRITION INTERVENTION  Nutrition education (E-1) on the following topics:  Nutrition and Diabetes education provided on My Plate, CHO counting, meal planning, portion sizes, timing of meals, avoiding snacks between meals unless having a low blood sugar, target ranges for A1C and blood sugars, signs/symptoms and treatment of hyper/hypoglycemia, monitoring blood sugars, taking medications as prescribed, benefits of exercising 30 minutes per day and prevention of complications of DM.   Lifestyle Medicine - Whole Food, Plant Predominant Nutrition is highly recommended: Eat Plenty of vegetables, Mushrooms, fruits, Legumes, Whole Grains, Nuts, seeds in lieu of processed meats, processed snacks/pastries red meat, poultry, eggs.    -It is better to avoid simple carbohydrates including: Cakes, Sweet Desserts, Ice Cream, Soda (diet and regular), Sweet Tea, Candies, Chips, Cookies, Store Bought Juices, Alcohol in  Excess of  1-2 drinks a day, Lemonade,  Artificial Sweeteners, Doughnuts, Coffee Creamers, "Sugar-free" Products, etc, etc.  This is not a complete list.....  Exercise: If you are able: 30 -60 minutes a day ,4 days a week, or 150 minutes a week.  The longer the better.  Combine stretch, strength, and aerobic activities.  If you were told in the past that you have high risk for cardiovascular diseases, you may seek evaluation by your heart doctor prior to initiating moderate to intense exercise programs  Handouts Provided Include  Lifestyle Medicine Plant based meal plan Calorie density of foods Meal Plan Card  Learning Style & Readiness for Change Teaching method utilized: Visual & Auditory  Demonstrated degree of understanding via: Teach Back  Barriers to learning/adherence to lifestyle change: none  Goals Established by Pt  Increase walking on weekends Lose 2-3 lbs per month Get A1C down to 5.7% or below.    MONITORING & EVALUATION Dietary intake, weekly physical activity, and blood sugars in 3-4 month.  Next Steps  Patient is to work on meal planning and more plant based foods.

## 2021-08-09 ENCOUNTER — Encounter: Payer: Self-pay | Admitting: Nutrition

## 2021-11-14 ENCOUNTER — Ambulatory Visit: Payer: PRIVATE HEALTH INSURANCE | Admitting: Nutrition

## 2021-11-26 ENCOUNTER — Encounter (INDEPENDENT_AMBULATORY_CARE_PROVIDER_SITE_OTHER): Payer: Self-pay | Admitting: *Deleted

## 2021-12-10 ENCOUNTER — Other Ambulatory Visit: Payer: Self-pay | Admitting: Physician Assistant

## 2022-05-26 ENCOUNTER — Other Ambulatory Visit: Payer: Self-pay | Admitting: Cardiology

## 2022-06-24 ENCOUNTER — Other Ambulatory Visit: Payer: Self-pay | Admitting: Cardiology

## 2023-09-01 ENCOUNTER — Other Ambulatory Visit (HOSPITAL_COMMUNITY): Payer: Self-pay | Admitting: Family Medicine

## 2023-09-01 DIAGNOSIS — R748 Abnormal levels of other serum enzymes: Secondary | ICD-10-CM

## 2023-09-12 ENCOUNTER — Ambulatory Visit (HOSPITAL_COMMUNITY)
Admission: RE | Admit: 2023-09-12 | Discharge: 2023-09-12 | Disposition: A | Discharge status: Home / Self Care | Source: Ambulatory Visit | Attending: Family Medicine | Admitting: Family Medicine

## 2023-09-12 DIAGNOSIS — R748 Abnormal levels of other serum enzymes: Secondary | ICD-10-CM | POA: Diagnosis present

## 2023-09-22 ENCOUNTER — Encounter (HOSPITAL_COMMUNITY): Payer: Self-pay | Admitting: Emergency Medicine

## 2023-09-22 ENCOUNTER — Emergency Department (HOSPITAL_COMMUNITY)

## 2023-09-22 ENCOUNTER — Emergency Department (HOSPITAL_COMMUNITY): Admission: EM | Admit: 2023-09-22 | Discharge: 2023-09-22 | Disposition: A | Discharge status: Home / Self Care

## 2023-09-22 DIAGNOSIS — I10 Essential (primary) hypertension: Secondary | ICD-10-CM | POA: Diagnosis present

## 2023-09-22 DIAGNOSIS — Z96642 Presence of left artificial hip joint: Secondary | ICD-10-CM | POA: Diagnosis not present

## 2023-09-22 LAB — CBC
HCT: 47.9 % (ref 39.0–52.0)
Hemoglobin: 16.2 g/dL (ref 13.0–17.0)
MCH: 30.9 pg (ref 26.0–34.0)
MCHC: 33.8 g/dL (ref 30.0–36.0)
MCV: 91.2 fL (ref 80.0–100.0)
Platelets: 255 K/uL (ref 150–400)
RBC: 5.25 MIL/uL (ref 4.22–5.81)
RDW: 12.4 % (ref 11.5–15.5)
WBC: 11.6 K/uL — ABNORMAL HIGH (ref 4.0–10.5)
nRBC: 0 % (ref 0.0–0.2)

## 2023-09-22 LAB — COMPREHENSIVE METABOLIC PANEL WITH GFR
ALT: 45 U/L — ABNORMAL HIGH (ref 0–44)
AST: 71 U/L — ABNORMAL HIGH (ref 15–41)
Albumin: 4.4 g/dL (ref 3.5–5.0)
Alkaline Phosphatase: 78 U/L (ref 38–126)
Anion gap: 11 (ref 5–15)
BUN: <5 mg/dL — ABNORMAL LOW (ref 6–20)
CO2: 26 mmol/L (ref 22–32)
Calcium: 9.4 mg/dL (ref 8.9–10.3)
Chloride: 103 mmol/L (ref 98–111)
Creatinine, Ser: 0.94 mg/dL (ref 0.61–1.24)
GFR, Estimated: >60 mL/min (ref >60)
Glucose, Bld: 178 mg/dL — ABNORMAL HIGH (ref 70–99)
Potassium: 4.2 mmol/L (ref 3.5–5.1)
Sodium: 140 mmol/L (ref 135–145)
Total Bilirubin: 0.8 mg/dL (ref 0.0–1.2)
Total Protein: 7.7 g/dL (ref 6.5–8.1)

## 2023-09-22 LAB — TROPONIN I (HIGH SENSITIVITY): Troponin I (High Sensitivity): 5 ng/L (ref <18)

## 2023-09-22 MED ORDER — ACETAMINOPHEN 500 MG PO TABS
1000.0000 mg | ORAL_TABLET | Freq: Once | ORAL | Status: AC
  Administered 2023-09-22: 1000 mg via ORAL
  Filled 2023-09-22: qty 2

## 2023-09-22 MED ORDER — DILTIAZEM HCL ER COATED BEADS 120 MG PO CP24
120.0000 mg | ORAL_CAPSULE | Freq: Once | ORAL | Status: AC
  Administered 2023-09-22: 120 mg via ORAL
  Filled 2023-09-22: qty 1

## 2023-09-22 NOTE — Discharge Instructions (Signed)
 You were seen in the emergency department for high blood pressure.  While you are here we did a physical exam, labs, EKG and chest x-ray which were all reassuring.  We started you on your blood pressure medication as prescribed by your primary care doctor.  Please pick up this prescription tomorrow as previously prescribed.  Please follow-up with your primary care doctor for reevaluation within a week.  You may need additional blood pressure medications to keep your blood pressure within normal limits.  Please come back to the emergency department he has chest pain, shortness of breath, severe headache, vision changes or if you have another reason to believe an emergency care.

## 2023-09-22 NOTE — ED Triage Notes (Signed)
 Pt c.o HTN today as high as 190, pt also c.o dizziness and shortness of breath

## 2023-09-22 NOTE — ED Provider Notes (Signed)
 [place] EMERGENCY DEPARTMENT AT [facility] Provider Note HPI Derrick Barr is a 52 y.o. male with a medical history of gout, family hx early CAD, prediabetes on metformin who presents emergency department due to concerns for hypertension.  Patient states that he never had a problem with his blood pressure up until about 3 months ago.  He went to his primary care doctor and they were concerned for elevated blood pressure.  On his recent reevaluation at his PCPs office he was also reportedly hypertensive.  He was started on diltiazem  but has not been able to pick this medication up.   He denies chest pain, shortness of breath, weakness, fevers, recent infectious symptoms.  He does have a dull headache since this morning.  Denies vision changes.  Past Medical History:  Diagnosis Date   Anxiety    Chronic gout followed by pcp   08-11-2019 per pt last episode left knee 06/ 2021   Depression    Dysuria    Hematuria    History of kidney stones    OA (osteoarthritis)    knees, hips   Prediabetes    Renal calculus, bilateral    Urgency of urination    Past Surgical History:  Procedure Laterality Date   CYSTOSCOPY WITH RETROGRADE PYELOGRAM, URETEROSCOPY AND STENT PLACEMENT Bilateral 07/31/2019   Procedure: CYSTOSCOPY WITH RETROGRADE PYELOGRAM, URETEROSCOPY AND STENT PLACEMENT;  Surgeon: Alvaro Hummer, MD;  Location: [facility];  Service: Urology;  Laterality: Bilateral;   CYSTOSCOPY WITH RETROGRADE PYELOGRAM, URETEROSCOPY AND STENT PLACEMENT Bilateral 08/18/2019   Procedure: CYSTOSCOPY WITH RETROGRADE PYELOGRAM, URETEROSCOPY AND STENT PLACEMENT;  Surgeon: Alvaro Hummer, MD;  Location: [facility];  Service: Urology;  Laterality: Bilateral;  75 MINS   EYE SURGERY Left age 18   BB 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   HOLMIUM LASER APPLICATION Bilateral 08/18/2019   Procedure: HOLMIUM LASER APPLICATION;  Surgeon: Alvaro Hummer, MD;  Location: [facility];   Service: Urology;  Laterality: Bilateral;   LEFT HEART CATH AND CORONARY ANGIOGRAPHY N/A 02/20/2021   Procedure: LEFT HEART CATH AND CORONARY ANGIOGRAPHY;  Surgeon: Dann Candyce RAMAN, MD;  Location: [facility];  Service: Cardiovascular;  Laterality: N/A;   TOTAL HIP ARTHROPLASTY Left 06/28/2019   Procedure: LEFT TOTAL HIP ARTHROPLASTY ANTERIOR APPROACH;  Surgeon: Jerri Kay HERO, MD;  Location: [facility];  Service: Orthopedics;  Laterality: Left;   WISDOM TOOTH EXTRACTION      Review of Systems Pertinent positives and negative findings are listed as part of the History of Present Illness and MDM  Physical Exam Vitals:   09/22/23 1540 09/22/23 1743 09/22/23 1900  BP: (!) 173/92 (!) 140/97 135/80  Pulse: 83  81  Resp: 18  16  Temp: 98 F (36.7 C)    SpO2: 96%  98%     Constitutional Nursing notes reviewed Vital signs reviewed  HEENT No obvious trauma Pupils round, equal, and reactive to light. Pupils cross midline Neck supple  Respiratory Effort normal Breathing well on room air CTAB  CV Normal rate and rhythm  No pitting edema  Abdomen Soft, non-tender, non-distended No peritonitis  MSK Atraumatic No obvious deformity ROM appropriate  Neuro Awake and alert Pupils cross midline Moving all extremities    MDM:  Initial Differential Diagnoses includes hypertensive urgency, hypertensive emergency, asymptomatic hypertension, AKI, ACS, pneumonia, pneumothorax, CHF  I reviewed the patient's vitals, the nursing triage note and evaluated the patient at bedside.  Patient presents with hypertension and a  minor dull headache.  I reviewed the patient's external records which show that the patient's last admission was in 2023 to cardiology for NSVT that resolved with amiodarone .  He was placed on a beta-blocker at this time.  Chart review also reveals that the patient had a left heart cath during this hospitalization which showed mild diffuse atherosclerosis with no significant  lesions.  I reviewed and interpreted the EKG.  It shows normal sinus rhythm without any evidence of acute ischemic changes.  There is no STEMI or abnormal ST depression.  No contiguous T wave inversions.  No new onset arrhythmia or high-grade conduction block.  The PR, QRS and QT intervals are within normal limits.  Single troponin ordered which was 5.  Patient has no chest pain or shortness of breath.  I have very low suspicion for ACS with his history, EKG and troponin.  No evidence of volume overload to suggest new onset heart failure.  Chest x-ray shows no evidence of pneumonia, pneumothorax, mediastinal widening or pleural effusion.  Labs are reassuring with no evidence of AKI or anemia.  Patient given his recently prescribed dose of diltiazem  which she has not yet started.  He was also given Tylenol  with improvement of his headache.  He has no vision changes, weakness, numbness.  Cranial nerves are intact.  He will pick up his diltiazem  prescription from his pharmacy tomorrow and follow-up with his PCP for further evaluation of hypertension.  Hypertension improved with blood pressure at discharge of 138/80.  Procedures: Procedures  Medications administered in the ED: Medications  acetaminophen  (TYLENOL ) tablet 1,000 mg (1,000 mg Oral Given 09/22/23 1741)  diltiazem  (CARDIZEM  CD) 24 hr capsule 120 mg (120 mg Oral Given 09/22/23 1743)     Impression: 1. Hypertension, unspecified type      Patient's presentation is most consistent with acute presentation with potential threat to life or bodily function.  Disposition: ED Disposition:  Discharge   Discharge: Patient is felt to be medically appropriate for discharge at this time. Patient was instructed to follow up with their primary care doctor/specialists listed above for re-evaluation. Patient was given strict return precautions.  ED Discharge Orders     None             Dionisio Blunt, MD 09/22/23 2241    Ula Prentice SAUNDERS,  MD 09/22/23 [phone]

## 2023-10-18 ENCOUNTER — Emergency Department (HOSPITAL_COMMUNITY)

## 2023-10-18 ENCOUNTER — Emergency Department (HOSPITAL_COMMUNITY)
Admission: EM | Admit: 2023-10-18 | Discharge: 2023-10-18 | Disposition: A | Discharge status: Home / Self Care | Attending: Emergency Medicine | Admitting: Emergency Medicine

## 2023-10-18 ENCOUNTER — Ambulatory Visit
Admission: EM | Admit: 2023-10-18 | Discharge: 2023-10-18 | Disposition: A | Discharge status: Home / Self Care | Attending: Internal Medicine | Admitting: Internal Medicine

## 2023-10-18 ENCOUNTER — Other Ambulatory Visit: Payer: Self-pay

## 2023-10-18 ENCOUNTER — Encounter (HOSPITAL_COMMUNITY): Payer: Self-pay

## 2023-10-18 DIAGNOSIS — R110 Nausea: Secondary | ICD-10-CM

## 2023-10-18 DIAGNOSIS — R109 Unspecified abdominal pain: Secondary | ICD-10-CM

## 2023-10-18 DIAGNOSIS — Z79899 Other long term (current) drug therapy: Secondary | ICD-10-CM | POA: Diagnosis not present

## 2023-10-18 DIAGNOSIS — N132 Hydronephrosis with renal and ureteral calculous obstruction: Secondary | ICD-10-CM | POA: Insufficient documentation

## 2023-10-18 DIAGNOSIS — N201 Calculus of ureter: Secondary | ICD-10-CM

## 2023-10-18 DIAGNOSIS — Z9889 Other specified postprocedural states: Secondary | ICD-10-CM

## 2023-10-18 DIAGNOSIS — Z7984 Long term (current) use of oral hypoglycemic drugs: Secondary | ICD-10-CM | POA: Diagnosis not present

## 2023-10-18 LAB — POCT URINE DIPSTICK
Bilirubin, UA: NEGATIVE
Blood, UA: NEGATIVE
Glucose, UA: NEGATIVE mg/dL
Ketones, POC UA: NEGATIVE mg/dL
Leukocytes, UA: NEGATIVE
Nitrite, UA: NEGATIVE
POC PROTEIN,UA: NEGATIVE
Spec Grav, UA: 1.025 (ref 1.010–1.025)
Urobilinogen, UA: 0.2 U/dL
pH, UA: 5.5 (ref 5.0–8.0)

## 2023-10-18 LAB — CBC
HCT: 46.5 % (ref 39.0–52.0)
Hemoglobin: 15.9 g/dL (ref 13.0–17.0)
MCH: 31.4 pg (ref 26.0–34.0)
MCHC: 34.2 g/dL (ref 30.0–36.0)
MCV: 91.7 fL (ref 80.0–100.0)
Platelets: 269 K/uL (ref 150–400)
RBC: 5.07 MIL/uL (ref 4.22–5.81)
RDW: 12.7 % (ref 11.5–15.5)
WBC: 17.9 K/uL — ABNORMAL HIGH (ref 4.0–10.5)
nRBC: 0 % (ref 0.0–0.2)

## 2023-10-18 LAB — URINALYSIS, ROUTINE W REFLEX MICROSCOPIC
Bilirubin Urine: NEGATIVE
Glucose, UA: NEGATIVE mg/dL
Hgb urine dipstick: NEGATIVE
Ketones, ur: NEGATIVE mg/dL
Leukocytes,Ua: NEGATIVE
Nitrite: NEGATIVE
Protein, ur: NEGATIVE mg/dL
Specific Gravity, Urine: 1.021 (ref 1.005–1.030)
pH: 5 (ref 5.0–8.0)

## 2023-10-18 LAB — BASIC METABOLIC PANEL WITH GFR
Anion gap: 17 — ABNORMAL HIGH (ref 5–15)
BUN: 13 mg/dL (ref 6–20)
CO2: 24 mmol/L (ref 22–32)
Calcium: 9.5 mg/dL (ref 8.9–10.3)
Chloride: 96 mmol/L — ABNORMAL LOW (ref 98–111)
Creatinine, Ser: 1.54 mg/dL — ABNORMAL HIGH (ref 0.61–1.24)
GFR, Estimated: 54 mL/min — ABNORMAL LOW (ref >60)
Glucose, Bld: 166 mg/dL — ABNORMAL HIGH (ref 70–99)
Potassium: 3.9 mmol/L (ref 3.5–5.1)
Sodium: 137 mmol/L (ref 135–145)

## 2023-10-18 MED ORDER — ONDANSETRON HCL 4 MG/2ML IJ SOLN
4.0000 mg | Freq: Once | INTRAMUSCULAR | Status: AC
  Administered 2023-10-18: 4 mg via INTRAVENOUS
  Filled 2023-10-18: qty 2

## 2023-10-18 MED ORDER — SODIUM CHLORIDE 0.9 % IV BOLUS
500.0000 mL | Freq: Once | INTRAVENOUS | Status: AC
  Administered 2023-10-18: 500 mL via INTRAVENOUS

## 2023-10-18 MED ORDER — TAMSULOSIN HCL 0.4 MG PO CAPS: 0.4000 mg | ORAL_CAPSULE | Freq: Every day | ORAL | 0 refills | Status: DC

## 2023-10-18 MED ORDER — ONDANSETRON HCL 4 MG PO TABS: 4.0000 mg | ORAL_TABLET | Freq: Four times a day (QID) | ORAL | 0 refills | Status: AC

## 2023-10-18 MED ORDER — OXYCODONE-ACETAMINOPHEN 5-325 MG PO TABS: 1.0000 | ORAL_TABLET | ORAL | 0 refills | Status: AC | PRN

## 2023-10-18 MED ORDER — HYDROMORPHONE HCL 1 MG/ML IJ SOLN
1.0000 mg | Freq: Once | INTRAMUSCULAR | Status: AC
  Administered 2023-10-18: 1 mg via INTRAVENOUS
  Filled 2023-10-18: qty 1

## 2023-10-18 NOTE — ED Triage Notes (Signed)
 Pt reports he has low right side back pain, nausea,  and abdominal pain x 2 days   Took goody powder.   Denies hematuria and pain with urination.

## 2023-10-18 NOTE — ED Provider Notes (Signed)
 [facility] AT [facility] Provider Note   CSN: [id number] Arrival date & time: 10/18/23  1245     Patient presents with: Flank Pain   Derrick Barr is a 53 y.o. male.    Flank Pain Pertinent negatives include no chest pain, no abdominal pain and no shortness of breath.       Derrick Barr is a 53 y.o. male who presents to the Emergency Department complaining of right flank pain with onset yesterday.  Has history of kidney stones and current pain feels similar to previous stones.  He describes persistent pain of his flank and pain associated with nausea and few episodes of vomiting.  He was seen at local urgent care earlier today and advised to come to ER for further evaluation.  He denies fever, burning with urination and pain or swelling of his genitals.    Prior to Admission medications   Medication Sig Start Date End Date Taking? Authorizing Provider  allopurinol  (ZYLOPRIM ) 100 MG tablet Take 1 tablet by mouth daily as needed (gout).    [provider]  atorvastatin  (LIPITOR ) 80 MG tablet TAKE 1 TABLET BY MOUTH DAILY AT 6 PM. 07/05/21   Cindie Ole ONEIDA, MD  diclofenac Sodium (VOLTAREN) 1 % GEL Apply 2 g topically 2 (two) times daily as needed (joint pain).    [provider]  metFORMIN (GLUCOPHAGE-XR) 500 MG 24 hr tablet Take 500 mg by mouth daily. 02/13/21   [provider]  metoprolol  succinate (TOPROL -XL) 25 MG 24 hr tablet TAKE 3 TABLETS BY MOUTH TWICE A DAY 06/25/22   Cindie Ole ONEIDA, MD  sertraline  (ZOLOFT ) 50 MG tablet Take 50 mg by mouth daily.    [provider]    Allergies: Augmentin  [amoxicillin -pot clavulanate]    Review of Systems  Constitutional:  Negative for appetite change, chills and fever.  Respiratory:  Negative for shortness of breath.   Cardiovascular:  Negative for chest pain.  Gastrointestinal:  Positive for nausea and vomiting. Negative for abdominal pain.  Genitourinary:   Positive for flank pain. Negative for difficulty urinating, dysuria, hematuria, penile pain, penile swelling, scrotal swelling and testicular pain.  Neurological:  Negative for weakness and numbness.    Updated Vital Signs BP (!) 148/88   Pulse 84   Temp 98.2 F (36.8 C) (Oral)   Resp 18   Ht 5' 11 (1.803 m)   Wt 110.2 kg   SpO2 96%   BMI 33.89 kg/m   Physical Exam Vitals and nursing note reviewed.  Constitutional:      General: He is not in acute distress.    Appearance: Normal appearance. He is not toxic-appearing.     Comments: Pt is uncomfortable appearing.    HENT:     Mouth/Throat:     Mouth: Mucous membranes are moist.  Cardiovascular:     Rate and Rhythm: Normal rate and regular rhythm.     Pulses: Normal pulses.  Pulmonary:     Effort: Pulmonary effort is normal.  Abdominal:     General: There is no distension.     Palpations: Abdomen is soft.     Tenderness: There is no abdominal tenderness. There is no right CVA tenderness or left CVA tenderness.  Musculoskeletal:        General: Normal range of motion.     Right lower leg: No edema.     Left lower leg: No edema.  Skin:    General: Skin is warm.  Capillary Refill: Capillary refill takes less than 2 seconds.  Neurological:     General: No focal deficit present.     Mental Status: He is alert.     Motor: No weakness.     (all labs ordered are listed, but only abnormal results are displayed) Labs Reviewed  URINALYSIS, ROUTINE W REFLEX MICROSCOPIC - Abnormal; Notable for the following components:      Result Value   Color, Urine AMBER (*)    APPearance CLOUDY (*)    All other components within normal limits  BASIC METABOLIC PANEL WITH GFR - Abnormal; Notable for the following components:   Chloride 96 (*)    Glucose, Bld 166 (*)    Creatinine, Ser 1.54 (*)    GFR, Estimated 54 (*)    Anion gap 17 (*)    All other components within normal limits  CBC - Abnormal; Notable for the following  components:   WBC 17.9 (*)    All other components within normal limits    EKG: None  Radiology: CT Renal Stone Study Result Date: 10/18/2023 EXAM: CT UROGRAM 10/18/2023 01:01:46 PM TECHNIQUE: CT of the abdomen and pelvis was performed without the administration of intravenous contrast as per CT urogram protocol. Multiplanar reformatted images as well as MIP urogram images are provided for review. Automated exposure control, iterative reconstruction, and/or weight based adjustment of the mA/kV was utilized to reduce the radiation dose to as low as reasonably achievable. COMPARISON: 07/31/2019 CLINICAL HISTORY: Abdominal/flank pain, stone suspected. Presenting for chief complaint of flank pain, nausea, vomiting, and right sided abdominal pain that started 2 days ago. Pain started to the right flank 2 days ago and he has felt the pain move lower into the low back where it has now stayed for the last 24 hours. Significant past medical history of kidney stones, specifically ureteral stone with hydronephrosis requiring lithotripsy. He is nauseous and has not thrown up today. Last episode of vomiting was yesterday. Denies dysuria, urinary hesitancy, gross hematuria, urinary frequency, urinary urgency, and dizziness. Denies fever and chills. Pain to the right back radiates through the body and into the right abdomen. States this feels like the last time he had a kidney stone. FINDINGS: LOWER CHEST: No acute abnormality. LIVER: Mild diffuse hepatic steatosis. GALLBLADDER AND BILE DUCTS: Gallbladder is unremarkable. No biliary ductal dilatation. SPLEEN: No acute abnormality. PANCREAS: No acute abnormality. ADRENAL GLANDS: No acute abnormality. KIDNEYS, URETERS AND BLADDER: Multiple punctate stones are identified within the upper and lower pole of the left kidney. Multiple right renal calculi are also noted which measure up to 5 mm. There is right-sided hydronephrosis and proximal hydroureter secondary to a 7 mm  stone in the proximal right ureter (image 53/2). A benign Bosniak class 1 cyst arises from the upper pole of the right kidney measuring 9.3 cm (image 36/2). No follow-up imaging recommended. No perinephric or periureteral stranding. Urinary bladder is unremarkable. GI AND BOWEL: Stomach demonstrates no acute abnormality. The appendix is visualized and appears normal. Colonic diverticulosis. No signs of acute diverticulitis. There is no bowel obstruction. PERITONEUM AND RETROPERITONEUM: No ascites. No free air. VASCULATURE: Aorta is normal in caliber. Aortic atherosclerotic calcification. LYMPH NODES: No lymphadenopathy. REPRODUCTIVE ORGANS: Prostate gland appears normal. BONES AND SOFT TISSUES: Status post left hip arthroplasty. No acute osseous abnormality. No focal soft tissue abnormality. IMPRESSION: 1. Right-sided hydronephrosis and proximal hydroureter due to a 7 mm proximal right ureteral stone. 2. Bilateral nephrolithiasis: multiple right renal calculi up to 5 mm  and multiple punctate left renal stones. Electronically signed by: Waddell Calk MD 10/18/2023 01:26 PM EDT RP Workstation: [id number]     Procedures   Medications Ordered in the ED  HYDROmorphone  (DILAUDID ) injection 1 mg (1 mg Intravenous Given 10/18/23 1416)  ondansetron  (ZOFRAN ) injection 4 mg (4 mg Intravenous Given 10/18/23 1417)                                    Medical Decision Making Amount and/or Complexity of Data Reviewed Labs: ordered.    Details: Leukocytosis felt to be traumatic, chemistries serum creatinine of 1.5 this is above his baseline from 3 weeks ago.  Urinalysis without evidence of hematuria or infection Radiology: ordered.    Details: CT renal stone study shows right-sided hydronephrosis and a proximal hydroureter with 7 mm stone in the proximal ureter.  There is bilateral nephrolithiasis as well Discussion of management or test interpretation with external provider(s): Patient here with right flank pain  and workup today shows a 7 mm stone in the proximal right ureter.  There is hydronephrosis and hydroureter as well.  He is post void residual was 6 mL.  Pain has improved here after IV pain medication and nausea medicine.  He does have a slightly elevated serum creatinine today and was given small amt IV fluids.  Resting comfortably on recheck, I feel that he is appropriate for discharge home at this time, he will follow-up closely outpatient with his PCP to have his creatinine level rechecked next week.  Have also recommended close outpatient follow-up with urology early next week as well.  Short course of pain medication and antiemetic and Flomax  will be prescribed.  Database was reviewed.  He was given strict return precautions as well   Risk Prescription drug management.        Final diagnoses:  Ureterolithiasis    ED Discharge Orders     None          Herlinda Milling, PA-C 10/18/23 1559    Franklyn Sid SAILOR, MD 10/19/23 [phone]

## 2023-10-18 NOTE — ED Provider Notes (Signed)
 [facility]    CSN: [id number] Arrival date & time: 10/18/23  1124      History   Chief Complaint No chief complaint on file.   HPI Derrick Barr is a 53 y.o. male.   Derrick Barr is a 53 y.o. male presenting for chief complaint of flank pain, nausea, vomiting, and right sided abdominal pain that started 2 days ago.  Pain started to the right flank 2 days ago and he has felt the pain move lower into the low back where it has now stayed for the last 24 hours.  Significant past medical history of kidney stones, specifically ureteral stone with hydronephrosis requiring lithotripsy.  He is nauseous and has not thrown up today.  Last episode of vomiting was yesterday.  Denies dysuria, urinary hesitancy, gross hematuria, urinary frequency, urinary urgency, and dizziness.  Denies fever and chills.  Pain to the right back radiates through the body and into the right abdomen.  States this feels like the last time he had a kidney stone.     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/28/2019   Unilateral primary osteoarthritis, left hip 06/01/2019   Acute idiopathic gout of left knee 03/10/2019   Depression 01/01/2015    Past Surgical History:  Procedure Laterality Date   CYSTOSCOPY WITH RETROGRADE PYELOGRAM, URETEROSCOPY AND STENT PLACEMENT Bilateral 07/31/2019   Procedure: CYSTOSCOPY WITH RETROGRADE PYELOGRAM, URETEROSCOPY AND STENT PLACEMENT;  Surgeon: Alvaro Hummer, MD;  Location: [facility];  Service: Urology;  Laterality: Bilateral;   CYSTOSCOPY WITH RETROGRADE PYELOGRAM, URETEROSCOPY AND STENT PLACEMENT Bilateral 08/18/2019   Procedure: CYSTOSCOPY WITH RETROGRADE PYELOGRAM, URETEROSCOPY AND STENT PLACEMENT;  Surgeon: Alvaro Hummer, MD;  Location: [facility];  Service: Urology;  Laterality: Bilateral;  75 MINS   EYE SURGERY Left age 11   BB 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   HOLMIUM LASER APPLICATION Bilateral 08/18/2019   Procedure: HOLMIUM LASER APPLICATION;  Surgeon: Alvaro Hummer, MD;  Location: [facility];  Service: Urology;  Laterality: Bilateral;   LEFT HEART CATH AND CORONARY ANGIOGRAPHY N/A 02/20/2021   Procedure: LEFT HEART CATH AND CORONARY ANGIOGRAPHY;  Surgeon: Dann Candyce RAMAN, MD;  Location: [facility] INVASIVE CV LAB;  Service: Cardiovascular;  Laterality: N/A;   TOTAL HIP ARTHROPLASTY Left 06/28/2019   Procedure: LEFT TOTAL HIP ARTHROPLASTY ANTERIOR APPROACH;  Surgeon: Jerri Kay HERO, MD;  Location: [facility];  Service: Orthopedics;  Laterality: Left;   WISDOM TOOTH EXTRACTION         Home Medications    Prior to Admission medications   Medication Sig Start Date End Date Taking? Authorizing Provider  allopurinol  (ZYLOPRIM ) 100 MG tablet Take 1 tablet by mouth daily as needed (gout).    [provider]  atorvastatin  (LIPITOR ) 80 MG tablet TAKE 1 TABLET BY MOUTH DAILY AT 6 PM. 07/05/21   Cindie Ole ONEIDA,  MD  diclofenac Sodium (VOLTAREN) 1 % GEL Apply 2 g topically 2 (two) times daily as needed (joint pain).    [provider]  metFORMIN (GLUCOPHAGE-XR) 500 MG 24 hr tablet Take 500 mg by mouth daily. 02/13/21   [provider]  metoprolol  succinate (TOPROL -XL) 25 MG 24 hr tablet TAKE 3 TABLETS BY MOUTH TWICE A DAY 06/25/22   Cindie Ole DASEN, MD  sertraline  (ZOLOFT ) 50 MG tablet Take 50 mg by mouth daily.    [provider]    Family History Family History  Problem  Relation Age of Onset   Heart attack Father 35    Social History Social History   Tobacco Use   Smoking status: Never   Smokeless tobacco: Never  Vaping Use   Vaping status: Former   Quit date: 08/10/2017  Substance Use Topics   Alcohol use: No    Alcohol/week: 0.0 standard drinks of alcohol   Drug use: Never     Allergies   Augmentin  [amoxicillin -pot clavulanate]   Review of Systems Review of Systems Per HPI  Physical Exam Triage Vital Signs ED Triage Vitals  Encounter Vitals Group     BP 10/18/23 1157 (!) 147/87     Girls Systolic BP Percentile --      Girls Diastolic BP Percentile --      Boys Systolic BP Percentile --      Boys Diastolic BP Percentile --      Pulse Rate 10/18/23 1157 82     Resp 10/18/23 1157 (!) 22     Temp 10/18/23 1157 98.1 F (36.7 C)     Temp Source 10/18/23 1157 Oral     SpO2 10/18/23 1157 94 %     Weight --      Height --      Head Circumference --      Peak Flow --      Pain Score 10/18/23 1155 7     Pain Loc --      Pain Education --      Exclude from Growth Chart --    No data found.  Updated Vital Signs BP (!) 147/87 (BP Location: Right Arm)   Pulse 82   Temp 98.1 F (36.7 C) (Oral)   Resp (!) 22   SpO2 94%   Visual Acuity Right Eye Distance:   Left Eye Distance:   Bilateral Distance:    Right Eye Near:   Left Eye Near:    Bilateral Near:     Physical Exam Vitals and nursing note reviewed.  Constitutional:      Appearance: He is ill-appearing. He is not toxic-appearing.  HENT:     Head: Normocephalic and atraumatic.     Right Ear: Hearing and external ear normal.     Left Ear: Hearing and external ear normal.     Nose: Nose normal.     Mouth/Throat:     Lips: Pink.     Mouth: Mucous membranes are moist.     Pharynx: No posterior oropharyngeal erythema.  Eyes:     General: Lids are normal. Vision grossly intact. Gaze aligned appropriately.     Extraocular Movements: Extraocular movements intact.      Conjunctiva/sclera: Conjunctivae normal.  Pulmonary:     Effort: Pulmonary effort is normal.  Abdominal:     General: Bowel sounds are normal.     Palpations: Abdomen is soft.     Tenderness: There is no abdominal tenderness. There is right CVA tenderness.  There is no left CVA tenderness or guarding.  Musculoskeletal:     Cervical back: Neck supple.  Skin:    General: Skin is warm and dry.     Capillary Refill: Capillary refill takes less than 2 seconds.     Findings: No rash.  Neurological:     General: No focal deficit present.     Mental Status: He is alert and oriented to person, place, and time. Mental status is at baseline.     Cranial Nerves: No dysarthria or facial asymmetry.  Psychiatric:        Mood and Affect: Mood normal.        Speech: Speech normal.        Behavior: Behavior normal.        Thought Content: Thought content normal.        Judgment: Judgment normal.      UC Treatments / Results  Labs (all labs ordered are listed, but only abnormal results are displayed) Labs Reviewed  POCT URINE DIPSTICK    EKG   Radiology No results found.  Procedures Procedures (including critical care time)  Medications Ordered in UC Medications - No data to display  Initial Impression / Assessment and Plan / UC Course  I have reviewed the triage vital signs and the nursing notes.  Pertinent labs & imaging results that were available during my care of the patient were reviewed by me and considered in my medical decision making (see chart for details).   1.  Right flank pain, history of lithotripsy, nausea without vomiting Patient is ill-appearing, hypertensive, and tachypneic at 22 respirations per minute.  Appears very symptomatic with intense pain to the right flank. I suspect he has a right ureteral stone causing symptoms. Urinalysis is unremarkable for signs of UTI/hematuria.  I would like for him to go to the ER for CT renal stone study to rule out  obstructive nephropathy given history and similar presentation.   Discussed clinical concerns/exam findings leading to recommendation for further workup in the ER setting and risks of deferring ER visit with patient/family. Patient/family express understanding and agreement with plan, discharged to ER via private car.   Final Clinical Impressions(s) / UC Diagnoses   Final diagnoses:  Right flank pain  History of lithotripsy  Nausea without vomiting   Discharge Instructions   None    ED Prescriptions   None    PDMP not reviewed this encounter.   Enedelia Dorna HERO, [place] 10/18/23 1242

## 2023-10-18 NOTE — ED Triage Notes (Signed)
 Pt has presumed kidney stone on right side. Pt stated that he can feel it move down but it has now stopped moving. Pt stated that he has had n/v for a couple of days as well as flank pain.

## 2023-10-18 NOTE — Discharge Instructions (Signed)
 Your workup today shows that you have a 7 mm stone on the right side.  This may be difficult to pass due to it's size, so I recommend that you contact the urologist listed on Monday to arrange follow-up appointment.  You have been given prescription for pain medication medication for nausea and Flomax  to help with your symptoms.  Your kidney function today was slightly elevated, this may be related to your kidney stone, but you will need to have your kidney functions rechecked in 1 week.  Your primary care provider can arrange this for you.  As discussed, please return the emergency department if you develop any new or worsening symptoms.

## 2023-10-18 NOTE — ED Notes (Signed)
 Patient is being discharged from the Urgent Care and sent to the Emergency Department via private vehicle . Per NP, patient is in need of higher level of care due to possible kidney stone obstruction. Patient is aware and verbalizes understanding of plan of care.  Vitals:   10/18/23 1157  BP: (!) 147/87  Pulse: 82  Resp: (!) 22  Temp: 98.1 F (36.7 C)  SpO2: 94%

## 2023-10-20 ENCOUNTER — Emergency Department (HOSPITAL_COMMUNITY)

## 2023-10-20 ENCOUNTER — Encounter (HOSPITAL_COMMUNITY): Disposition: A | Discharge status: Home / Self Care | Payer: Self-pay | Source: Home / Self Care | Attending: Urology

## 2023-10-20 ENCOUNTER — Ambulatory Visit: Payer: Self-pay | Admitting: Urology

## 2023-10-20 ENCOUNTER — Emergency Department (HOSPITAL_BASED_OUTPATIENT_CLINIC_OR_DEPARTMENT_OTHER): Admitting: Anesthesiology

## 2023-10-20 ENCOUNTER — Ambulatory Visit (HOSPITAL_BASED_OUTPATIENT_CLINIC_OR_DEPARTMENT_OTHER)
Admission: RE | Admit: 2023-10-20 | Discharge: 2023-10-20 | Disposition: A | Source: Ambulatory Visit | Attending: Urology | Admitting: Urology

## 2023-10-20 ENCOUNTER — Ambulatory Visit (HOSPITAL_COMMUNITY)
Admit: 2023-10-20 | Discharge: 2023-10-20 | Disposition: A | Discharge status: Home / Self Care | Attending: Urology | Admitting: Urology

## 2023-10-20 ENCOUNTER — Emergency Department (HOSPITAL_COMMUNITY): Admitting: Anesthesiology

## 2023-10-20 ENCOUNTER — Ambulatory Visit (INDEPENDENT_AMBULATORY_CARE_PROVIDER_SITE_OTHER): Admitting: Urology

## 2023-10-20 ENCOUNTER — Other Ambulatory Visit: Payer: Self-pay

## 2023-10-20 ENCOUNTER — Encounter (HOSPITAL_COMMUNITY): Payer: Self-pay | Admitting: Urology

## 2023-10-20 VITALS — BP 158/85 | HR 85 | Ht 71.0 in | Wt 240.0 lb

## 2023-10-20 DIAGNOSIS — Z794 Long term (current) use of insulin: Secondary | ICD-10-CM

## 2023-10-20 DIAGNOSIS — I10 Essential (primary) hypertension: Secondary | ICD-10-CM

## 2023-10-20 DIAGNOSIS — Z79899 Other long term (current) drug therapy: Secondary | ICD-10-CM | POA: Diagnosis not present

## 2023-10-20 DIAGNOSIS — Z7984 Long term (current) use of oral hypoglycemic drugs: Secondary | ICD-10-CM | POA: Insufficient documentation

## 2023-10-20 DIAGNOSIS — N201 Calculus of ureter: Secondary | ICD-10-CM

## 2023-10-20 DIAGNOSIS — M10062 Idiopathic gout, left knee: Secondary | ICD-10-CM

## 2023-10-20 DIAGNOSIS — N200 Calculus of kidney: Secondary | ICD-10-CM

## 2023-10-20 DIAGNOSIS — E119 Type 2 diabetes mellitus without complications: Secondary | ICD-10-CM

## 2023-10-20 DIAGNOSIS — N132 Hydronephrosis with renal and ureteral calculous obstruction: Secondary | ICD-10-CM | POA: Diagnosis present

## 2023-10-20 HISTORY — DX: Essential (primary) hypertension: I10

## 2023-10-20 HISTORY — PX: CYSTOSCOPY WITH STENT PLACEMENT: SHX5790

## 2023-10-20 HISTORY — DX: Type 2 diabetes mellitus without complications: E11.9

## 2023-10-20 LAB — GLUCOSE, CAPILLARY
Glucose-Capillary: 113 mg/dL — ABNORMAL HIGH (ref 70–99)
Glucose-Capillary: 126 mg/dL — ABNORMAL HIGH (ref 70–99)

## 2023-10-20 LAB — URINALYSIS, ROUTINE W REFLEX MICROSCOPIC
Bilirubin, UA: NEGATIVE
Glucose, UA: NEGATIVE
Ketones, UA: NEGATIVE
Leukocytes,UA: NEGATIVE
Nitrite, UA: NEGATIVE
Specific Gravity, UA: 1.025 (ref 1.005–1.030)
Urobilinogen, Ur: 0.2 mg/dL (ref 0.2–1.0)
pH, UA: 5.5 (ref 5.0–7.5)

## 2023-10-20 LAB — MICROSCOPIC EXAMINATION: RBC, Urine: >30 /HPF — AB (ref 0–2)

## 2023-10-20 SURGERY — CYSTOSCOPY, WITH STENT INSERTION
Anesthesia: General | Laterality: Right

## 2023-10-20 MED ORDER — ONDANSETRON HCL 4 MG/2ML IJ SOLN
INTRAMUSCULAR | Status: AC
  Filled 2023-10-20: qty 2

## 2023-10-20 MED ORDER — KETOROLAC TROMETHAMINE 60 MG/2ML IM SOLN
60.0000 mg | Freq: Once | INTRAMUSCULAR | Status: AC
  Administered 2023-10-20: 60 mg via INTRAMUSCULAR

## 2023-10-20 MED ORDER — PROPOFOL 10 MG/ML IV BOLUS
INTRAVENOUS | Status: DC | PRN
  Administered 2023-10-20: 200 mg via INTRAVENOUS

## 2023-10-20 MED ORDER — ORAL CARE MOUTH RINSE: 15.0000 mL | Freq: Once | OROMUCOSAL | Status: AC

## 2023-10-20 MED ORDER — SULFAMETHOXAZOLE-TRIMETHOPRIM 800-160 MG PO TABS: 1.0000 | ORAL_TABLET | Freq: Two times a day (BID) | ORAL | 0 refills | Status: DC

## 2023-10-20 MED ORDER — SODIUM CHLORIDE 0.9 % IR SOLN
Status: DC | PRN
  Administered 2023-10-20: 3000 mL via INTRAVESICAL

## 2023-10-20 MED ORDER — ONDANSETRON HCL 4 MG/2ML IJ SOLN
INTRAMUSCULAR | Status: DC | PRN
  Administered 2023-10-20: 4 mg via INTRAVENOUS

## 2023-10-20 MED ORDER — MIDAZOLAM HCL 2 MG/2ML IJ SOLN
INTRAMUSCULAR | Status: AC
  Filled 2023-10-20: qty 2

## 2023-10-20 MED ORDER — TAMSULOSIN HCL 0.4 MG PO CAPS: 0.4000 mg | ORAL_CAPSULE | Freq: Every day | ORAL | 1 refills | Status: AC

## 2023-10-20 MED ORDER — PROPOFOL 500 MG/50ML IV EMUL
INTRAVENOUS | Status: AC
Start: 2023-10-20 — End: 2023-10-20
  Filled 2023-10-20: qty 50

## 2023-10-20 MED ORDER — ACETAMINOPHEN 10 MG/ML IV SOLN: 1000.0000 mg | Freq: Once | INTRAVENOUS | Status: DC | PRN

## 2023-10-20 MED ORDER — IOHEXOL 300 MG/ML  SOLN
INTRAMUSCULAR | Status: DC | PRN
  Administered 2023-10-20: 10 mL via URETHRAL

## 2023-10-20 MED ORDER — LACTATED RINGERS IV SOLN: INTRAVENOUS | Status: DC

## 2023-10-20 MED ORDER — OXYCODONE HCL 5 MG/5ML PO SOLN: 5.0000 mg | Freq: Once | ORAL | Status: DC | PRN

## 2023-10-20 MED ORDER — FENTANYL CITRATE (PF) 100 MCG/2ML IJ SOLN
INTRAMUSCULAR | Status: AC
  Filled 2023-10-20: qty 2

## 2023-10-20 MED ORDER — LIDOCAINE HCL (CARDIAC) PF 100 MG/5ML IV SOSY
PREFILLED_SYRINGE | INTRAVENOUS | Status: DC | PRN
  Administered 2023-10-20: 60 mg via INTRAVENOUS

## 2023-10-20 MED ORDER — AMISULPRIDE (ANTIEMETIC) 5 MG/2ML IV SOLN: 10.0000 mg | Freq: Once | INTRAVENOUS | Status: DC | PRN

## 2023-10-20 MED ORDER — FENTANYL CITRATE (PF) 100 MCG/2ML IJ SOLN
INTRAMUSCULAR | Status: DC | PRN
  Administered 2023-10-20: 50 ug via INTRAVENOUS

## 2023-10-20 MED ORDER — HYOSCYAMINE SULFATE 0.125 MG PO TABS: 0.1250 mg | ORAL_TABLET | Freq: Four times a day (QID) | ORAL | 1 refills | Status: AC | PRN

## 2023-10-20 MED ORDER — INSULIN ASPART 100 UNIT/ML IJ SOLN: 0.0000 [IU] | INTRAMUSCULAR | Status: DC | PRN

## 2023-10-20 MED ORDER — OXYCODONE HCL 5 MG PO TABS: 5.0000 mg | ORAL_TABLET | Freq: Once | ORAL | Status: DC | PRN

## 2023-10-20 MED ORDER — CELECOXIB 200 MG PO CAPS: 200.0000 mg | ORAL_CAPSULE | Freq: Two times a day (BID) | ORAL | 1 refills | Status: DC

## 2023-10-20 MED ORDER — CELECOXIB 200 MG PO CAPS: 200.0000 mg | ORAL_CAPSULE | Freq: Two times a day (BID) | ORAL | 1 refills | Status: AC

## 2023-10-20 MED ORDER — DEXAMETHASONE SODIUM PHOSPHATE 10 MG/ML IJ SOLN
INTRAMUSCULAR | Status: DC | PRN
  Administered 2023-10-20: 10 mg via INTRAVENOUS

## 2023-10-20 MED ORDER — HYOSCYAMINE SULFATE 0.125 MG PO TABS: 0.1250 mg | ORAL_TABLET | Freq: Four times a day (QID) | ORAL | 1 refills | Status: DC | PRN

## 2023-10-20 MED ORDER — CIPROFLOXACIN IN D5W 400 MG/200ML IV SOLN
400.0000 mg | INTRAVENOUS | Status: AC
  Administered 2023-10-20: 400 mg via INTRAVENOUS
  Filled 2023-10-20: qty 200

## 2023-10-20 MED ORDER — CHLORHEXIDINE GLUCONATE 0.12 % MT SOLN
15.0000 mL | Freq: Once | OROMUCOSAL | Status: AC
  Administered 2023-10-20: 15 mL via OROMUCOSAL
  Filled 2023-10-20: qty 15

## 2023-10-20 MED ORDER — LIDOCAINE HCL (PF) 2 % IJ SOLN
INTRAMUSCULAR | Status: AC
  Filled 2023-10-20: qty 5

## 2023-10-20 MED ORDER — FENTANYL CITRATE PF 50 MCG/ML IJ SOSY: 25.0000 ug | PREFILLED_SYRINGE | INTRAMUSCULAR | Status: DC | PRN

## 2023-10-20 MED ORDER — MIDAZOLAM HCL 5 MG/5ML IJ SOLN
INTRAMUSCULAR | Status: DC | PRN
  Administered 2023-10-20: 2 mg via INTRAVENOUS

## 2023-10-20 MED ORDER — DEXAMETHASONE SODIUM PHOSPHATE 10 MG/ML IJ SOLN
INTRAMUSCULAR | Status: AC
Start: 2023-10-20 — End: 2023-10-20
  Filled 2023-10-20: qty 1

## 2023-10-20 MED ORDER — LACTATED RINGERS IV SOLN: INTRAVENOUS | Status: DC | PRN

## 2023-10-20 SURGICAL SUPPLY — 11 items
BAG URO CATCHER STRL LF (MISCELLANEOUS) ×1 IMPLANT
CATH URETL OPEN 5X70 (CATHETERS) ×1 IMPLANT
CLOTH BEACON ORANGE TIMEOUT ST (SAFETY) ×1 IMPLANT
GLOVE BIO SURGEON STRL SZ7 (GLOVE) ×1 IMPLANT
GOWN STRL REUS W/ TWL XL LVL3 (GOWN DISPOSABLE) ×1 IMPLANT
GUIDEWIRE ZIPWRE .038 STRAIGHT (WIRE) ×1 IMPLANT
KIT TURNOVER KIT A (KITS) ×1 IMPLANT
MANIFOLD NEPTUNE II (INSTRUMENTS) ×1 IMPLANT
PACK CYSTO (CUSTOM PROCEDURE TRAY) ×1 IMPLANT
STENT URET 6FRX26 CONTOUR (STENTS) IMPLANT
TUBING CONNECTING 10 (TUBING) ×1 IMPLANT

## 2023-10-20 NOTE — Anesthesia Preprocedure Evaluation (Addendum)
 Anesthesia Evaluation  Patient identified by MRN, date of birth, ID band Patient awake    Reviewed: Allergy & Precautions, NPO status , Patient's Chart, lab work & pertinent test results  Airway Mallampati: II  TM Distance: >3 FB Neck ROM: Full    Dental no notable dental hx.    Pulmonary neg pulmonary ROS   Pulmonary exam normal        Cardiovascular hypertension, Pt. on medications Normal cardiovascular exam     Neuro/Psych  PSYCHIATRIC DISORDERS Anxiety Depression    negative neurological ROS     GI/Hepatic negative GI ROS, Neg liver ROS,,,  Endo/Other  diabetes, Oral Hypoglycemic Agents, Insulin  Dependent    Renal/GU Renal disease     Musculoskeletal   Abdominal  (+) + obese  Peds  Hematology negative hematology ROS (+)   Anesthesia Other Findings right ureteral stone  Reproductive/Obstetrics                              Anesthesia Physical Anesthesia Plan  ASA: 3  Anesthesia Plan: General   Post-op Pain Management:    Induction: Intravenous  PONV Risk Score and Plan: 2 and Ondansetron , Dexamethasone , Midazolam  and Treatment may vary due to age or medical condition  Airway Management Planned: LMA  Additional Equipment:   Intra-op Plan:   Post-operative Plan: Extubation in OR  Informed Consent: I have reviewed the patients History and Physical, chart, labs and discussed the procedure including the risks, benefits and alternatives for the proposed anesthesia with the patient or authorized representative who has indicated his/her understanding and acceptance.     Dental advisory given  Plan Discussed with: CRNA  Anesthesia Plan Comments:         Anesthesia Quick Evaluation

## 2023-10-20 NOTE — Progress Notes (Signed)
 Chief Complaint: Kidney stone  History of Present Illness:  Derrick Barr is a 53 y.o. male who is seen in consultation from Shona Norleen PEDLAR, MD for evaluation of a right proximal ureteral stone.  He has a prior history of urolithiasis, followed by Dr. Alvaro at [facility] urology.  Last seen in 2021 after bilateral ureteroscopy.  Did not return as requested.  He has been symptomatic with a right proximal ureteral stone for the past 4 days.  Had CT urogram performed at [facility] emergency room 2 days ago revealing a 7 mm right proximal ureteral stone as well as smaller bilateral renal calculi.  Very large right upper pole Bosniak category 1 cyst.  Has had persistent 8/10 pain over the past day or 2.  Still on oxycodone .  There has been no associated fever.   Past Medical History:  Past Medical History:  Diagnosis Date   Anxiety    Chronic gout followed by pcp   08-11-2019 per pt last episode left knee 06/ 2021   Depression    Dysuria    Hematuria    History of kidney stones    OA (osteoarthritis)    knees, hips   Prediabetes    Renal calculus, bilateral    Urgency of urination     Past Surgical History:  Past Surgical History:  Procedure Laterality Date   CYSTOSCOPY WITH RETROGRADE PYELOGRAM, URETEROSCOPY AND STENT PLACEMENT Bilateral 07/31/2019   Procedure: CYSTOSCOPY WITH RETROGRADE PYELOGRAM, URETEROSCOPY AND STENT PLACEMENT;  Surgeon: Alvaro Hummer, MD;  [facility]: [facility];  Service: Urology;  Laterality: Bilateral;   CYSTOSCOPY WITH RETROGRADE PYELOGRAM, URETEROSCOPY AND STENT PLACEMENT Bilateral 08/18/2019   Procedure: CYSTOSCOPY WITH RETROGRADE PYELOGRAM, URETEROSCOPY AND STENT PLACEMENT;  Surgeon: Alvaro Hummer, MD;  Location: [facility];  Service: Urology;  Laterality: Bilateral;  75 MINS   EYE SURGERY Left age 73   BB 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   HOLMIUM LASER APPLICATION Bilateral 08/18/2019   Procedure: HOLMIUM LASER APPLICATION;  Surgeon: Alvaro Hummer, MD;  Location: [facility];  Service: Urology;  Laterality: Bilateral;   LEFT HEART CATH AND CORONARY ANGIOGRAPHY N/A 02/20/2021   Procedure: LEFT HEART CATH AND CORONARY ANGIOGRAPHY;  Surgeon: Dann Candyce RAMAN, MD;  Location: [facility] INVASIVE CV LAB;  Service: Cardiovascular;  Laterality: N/A;   TOTAL HIP ARTHROPLASTY Left 06/28/2019   Procedure: LEFT TOTAL HIP ARTHROPLASTY ANTERIOR APPROACH;  Surgeon: Jerri Kay HERO, MD;  Location: [facility];  Service: Orthopedics;  Laterality: Left;   WISDOM TOOTH EXTRACTION      Allergies:  Allergies  Allergen Reactions   Augmentin  [Amoxicillin -Pot Clavulanate] Hives    Family History:  Family History  Problem Relation Age of Onset   Heart attack Father 83    Social History:  Social History   Tobacco Use   Smoking status: Never   Smokeless tobacco: Never  Vaping Use   Vaping status: Former   Quit date: 08/10/2017  Substance Use Topics   Alcohol use: No    Alcohol/week: 0.0 standard drinks of alcohol   Drug use: Never    Review of symptoms:  Constitutional:  Negative for unexplained weight loss, night sweats, fever, chills ENT:  Negative for nose bleeds, sinus pain, painful swallowing CV:  Negative for chest pain, shortness of breath, exercise intolerance, palpitations, loss of consciousness Resp:  Negative for cough, wheezing, shortness of breath GI:  Negative for nausea, vomiting, diarrhea, bloody stools GU:  Positives noted in HPI; otherwise  negative for gross hematuria, dysuria, urinary incontinence Neuro:  Negative for seizures, poor balance, limb weakness, slurred speech Psych:  Negative for lack of energy, depression, anxiety Endocrine:  Negative for polydipsia, polyuria, symptoms of hypoglycemia (dizziness, hunger, sweating) Hematologic:  Negative for anemia, purpura, petechia, prolonged or excessive bleeding, use of anticoagulants  Allergic:  Negative for difficulty breathing or choking as a result of exposure  to anything; no shellfish allergy; no allergic response (rash/itch) to materials, foods  Physical exam: There were no vitals taken for this visit. GENERAL APPEARANCE:  Well appearing, well developed, well nourished, NAD HEENT: Atraumatic, Normocephalic. NECK: Normal appearance LUNGS: Normal inspiratory and expiratory excursion HEART: Regular Rate EXTREMITIES: Moves all extremities well.  Without clubbing, cyanosis, or edema. NEUROLOGIC:  Alert and oriented x 3, normal gait, CN II-XII grossly intact.  MENTAL STATUS:  Appropriate. SKIN:  Warm, dry and intact.    Results:  I have reviewed referring/[facility] physicians [facility]  I have reviewed urinalysis--microscopic hematuria, no evidence of infection  I have reviewed prior imaging--reading -- IMPRESSION: 1. Right-sided hydronephrosis and proximal hydroureter due to a 7 mm proximal right ureteral stone. 2. Bilateral nephrolithiasis: multiple right renal calculi up to 5 mm and multiple punctate left renal stones.  I reviewed as well as KUB from earlier today.  Stable position of right upper ureteral stone   Assessment: Moderate size right upper ureteral stone with significant symptoms   Plan: The patient at this point I think he is in need of urgent management.  I have contacted Dr. Lovie at [facility] urology about urgent cystoscopy and stent placement.  We will work on getting that scheduled this evening.

## 2023-10-20 NOTE — Anesthesia Procedure Notes (Signed)
 Procedure Name: LMA Insertion Date/Time: 10/20/2023 6:47 PM  Performed by: Deeann Eva BROCKS, CRNAPre-anesthesia Checklist: Patient identified, Emergency Drugs available, Suction available and Patient being monitored Patient Re-evaluated:Patient Re-evaluated prior to induction Oxygen Delivery Method: Circle System Utilized Preoxygenation: Pre-oxygenation with 100% oxygen Induction Type: IV induction Ventilation: Mask ventilation without difficulty LMA: LMA with gastric port inserted LMA Size: 4.0 Number of attempts: 1 Airway Equipment and Method: Bite block Placement Confirmation: positive ETCO2 Tube secured with: Tape Dental Injury: Teeth and Oropharynx as per pre-operative assessment

## 2023-10-20 NOTE — Transfer of Care (Signed)
 Immediate Anesthesia Transfer of Care Note  Patient: Derrick Barr  Procedure(s) Performed: CYSTOSCOPY, WITH STENT INSERTION, RETROGRADE (Right)  Patient Location: PACU  Anesthesia Type:General  Level of Consciousness: awake, alert , and oriented  Airway & Oxygen Therapy: Patient Spontanous Breathing  Post-op Assessment: Report given to RN and Post -op Vital signs reviewed and stable  Post vital signs: Reviewed and stable  Last Vitals:  Vitals Value Taken Time  BP 122/80 10/20/23 19:19  Temp    Pulse 70 10/20/23 19:24  Resp 16 10/20/23 19:24  SpO2 88 % 10/20/23 19:24  Vitals shown include unfiled device data.  Last Pain:  Vitals:   10/20/23 1811  TempSrc: Oral  PainSc: 3       Patients Stated Pain Goal: 2 (10/20/23 1811)  Complications: No notable events documented.

## 2023-10-20 NOTE — Discharge Instructions (Signed)

## 2023-10-20 NOTE — H&P (Signed)
 H&P  History of Present Illness: Derrick Barr is a 53 y.o. year old M with obstructing right ureteral stone, refractory pain. Here today for stent  Imaging reviewed - shows stone ~L4 on the right. Visible on KUB earlier today  Past Medical History:  Diagnosis Date   Anxiety    Chronic gout followed by pcp   08-11-2019 per pt last episode left knee 06/ 2021   Depression    Diabetes mellitus without complication (HCC)    Dysuria    Hematuria    History of kidney stones    Hypertension    OA (osteoarthritis)    knees, hips   Prediabetes    Renal calculus, bilateral    Urgency of urination     Past Surgical History:  Procedure Laterality Date   CYSTOSCOPY WITH RETROGRADE PYELOGRAM, URETEROSCOPY AND STENT PLACEMENT Bilateral 07/31/2019   Procedure: CYSTOSCOPY WITH RETROGRADE PYELOGRAM, URETEROSCOPY AND STENT PLACEMENT;  Surgeon: Alvaro Hummer, MD;  [facility];  Service: Urology;  Laterality: Bilateral;   CYSTOSCOPY WITH RETROGRADE PYELOGRAM, URETEROSCOPY AND STENT PLACEMENT Bilateral 08/18/2019   Procedure: CYSTOSCOPY WITH RETROGRADE PYELOGRAM, URETEROSCOPY AND STENT PLACEMENT;  Surgeon: Alvaro Hummer, MD;  Location: [facility];  Service: Urology;  Laterality: Bilateral;  75 MINS   EYE SURGERY Left age 53   BB 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   HOLMIUM LASER APPLICATION Bilateral 08/18/2019   Procedure: HOLMIUM LASER APPLICATION;  Surgeon: Alvaro Hummer, MD;  Location: [facility];  Service: Urology;  Laterality: Bilateral;   LEFT HEART CATH AND CORONARY ANGIOGRAPHY N/A 02/20/2021   Procedure: LEFT HEART CATH AND CORONARY ANGIOGRAPHY;  Surgeon: Dann Candyce RAMAN, MD;  Location: [facility] INVASIVE CV LAB;  Service: Cardiovascular;  Laterality: N/A;   TOTAL HIP ARTHROPLASTY Left 06/28/2019   Procedure: LEFT TOTAL HIP ARTHROPLASTY ANTERIOR APPROACH;  Surgeon: Jerri Kay HERO, MD;  [facility];  Service: Orthopedics;  Laterality: Left;    Ventricular Tachycadia     WISDOM TOOTH EXTRACTION      Home Medications:  Current Meds  Medication Sig   allopurinol  (ZYLOPRIM ) 100 MG tablet Take 1 tablet by mouth daily as needed (gout).   atorvastatin  (LIPITOR ) 80 MG tablet TAKE 1 TABLET BY MOUTH DAILY AT 6 PM.   B Complex-Biotin-FA (B-COMPLEET-100) TABS Take 1 tablet by mouth daily.   diltiazem  (CARDIZEM  CD) 120 MG 24 hr capsule Take 120 mg by mouth daily.   glipiZIDE (GLUCOTROL XL) 5 MG 24 hr tablet Take 5 mg by mouth daily.   LANTUS SOLOSTAR 100 UNIT/ML Solostar Pen Inject 20 Units into the skin at bedtime.   metFORMIN (GLUCOPHAGE-XR) 500 MG 24 hr tablet Take 500 mg by mouth daily.   Misc Natural Products (BEET ROOT) 500 MG CAPS Take 1 capsule by mouth daily.   ondansetron  (ZOFRAN ) 4 MG tablet Take 1 tablet (4 mg total) by mouth every 6 (six) hours. As needed for nausea and vomiting   oxyCODONE -acetaminophen  (PERCOCET/ROXICET) 5-325 MG tablet Take 1 tablet by mouth every 4 (four) hours as needed.   tamsulosin  (FLOMAX ) 0.4 MG CAPS capsule Take 1 capsule (0.4 mg total) by mouth daily.   traZODone  (DESYREL ) 100 MG tablet Take 100 mg by mouth at bedtime as needed for sleep.    Allergies:  Allergies  Allergen Reactions   Augmentin  [Amoxicillin -Pot Clavulanate] Hives    Family History  Problem Relation Age of Onset   Heart attack Father 59    Social History:  reports that he has  never smoked. He has never used smokeless tobacco. He reports that he does not drink alcohol and does not use drugs.  ROS: A complete review of systems was performed.  All systems are negative except for pertinent findings as noted.  Physical Exam:  Vital signs in last 24 hours: Temp:  [98.1 F (36.7 C)] 98.1 F (36.7 C) (09/29 1811) Pulse Rate:  [77-85] 77 (09/29 1811) Resp:  [18] 18 (09/29 1811) BP: (150-158)/(85-87) 150/87 (09/29 1811) SpO2:  [95 %] 95 % (09/29 1811) Weight:  [108.9 kg] 108.9 kg (09/29 1457) Constitutional:  Alert and  oriented, No acute distress Cardiovascular: Regular rate and rhythm Respiratory: Normal respiratory effort, Lungs clear bilaterally GI: Abdomen is soft, nontender, nondistended, no abdominal masses Lymphatic: No lymphadenopathy Neurologic: Grossly intact, no focal deficits Psychiatric: Normal mood and affect   Laboratory Data:  Recent Labs    10/18/23 1320  WBC 17.9*  HGB 15.9  HCT 46.5  PLT 269    Recent Labs    10/18/23 1320  NA 137  K 3.9  CL 96*  GLUCOSE 166*  BUN 13  CALCIUM  9.5  CREATININE 1.54*     Results for orders placed or performed during the hospital encounter of 10/20/23 (from the past 24 hours)  Glucose, capillary     Status: Abnormal   Collection Time: 10/20/23  6:06 PM  Result Value Ref Range   Glucose-Capillary 113 (H) 70 - 99 mg/dL   Comment 1 Notify RN    Comment 2 Document in Chart    Comment 3 Call MD NNP PA CNM    Recent Results (from the past 240 hours)  Microscopic Examination     Status: Abnormal   Collection Time: 10/20/23 12:00 AM   Urine  Result Value Ref Range Status   WBC, UA 0-5 0 - 5 /hpf Final   RBC, Urine >30 (A) 0 - 2 /hpf Final   Epithelial Cells (non renal) 0-10 0 - 10 /hpf Final   Mucus, UA Present Not Estab. Final   Bacteria, UA Few None seen/Few Final    Renal Function: Recent Labs    10/18/23 1320  CREATININE 1.54*   Estimated Creatinine Clearance: 69.6 mL/min (A) (by C-G formula based on SCr of 1.54 mg/dL (H)).  Radiologic Imaging: No results found.  Assessment:  Derrick Barr is a 53 y.o. year old M with obstructing right ureteral stone, refractory pain  Plan:  To OR for ureteral stent. Risks reviewed (hematuria, infection, sepsis, stent related issues, damage to GU tract)   Discussed need for staged procedure - either ESWL (if stone still visible on xray after stent) or URS with laser litho  Herlene Foot, MD 10/20/2023, 6:15 PM  [facility] Pager: [phone]

## 2023-10-20 NOTE — Op Note (Signed)
 Operative Note  Preoperative diagnosis:  1.  Right obstructing ureteral stone  Postoperative diagnosis: 1.  same  Procedure(s): 1.  Cystoscopy with right ureteral stent placement 2. right retrograde pyelogram with interpretation 3. Fluoroscopy <1 hour with intraoperative interpretation  Surgeon: Herlene Foot, MD  Assistants:  None  Anesthesia:  General  Complications:  None  EBL:  minimal  Specimens: 1. Urine culture   Drains/Catheters: 1.  Right 6Fr x 26cm ureteral stent  Intraoperative findings:   Cystoscopy demonstrated no suspicious lesions, masses, stones or other pathology. Right Retrograde pyelogram demonstrated proximal to stone in mid ureter hydronephrosis. The stone was clearly visible on fluoro both before and after stent placement Successful right ureteral stent placement with curl in the renal pelvis and bladder respectively.  Indication:  Derrick Barr is a 53 y.o. male with a right ureteral stone, refractory pain. After reviewing the management options for treatment, he elected to proceed with the above surgical procedure(s). We have discussed the potential benefits and risks of the procedure, side effects of the proposed treatment, the likelihood of the patient achieving the goals of the procedure, and any potential problems that might occur during the procedure or recuperation. Informed consent has been obtained.  Description of procedure: The patient was taken to the operating room and general anesthesia was induced.  The patient was placed in the dorsal lithotomy position, prepped and draped in the usual sterile fashion, and preoperative antibiotics were administered. A preoperative time-out was performed.   Cystourethroscopy was performed.  The patient's urethra was examined and was normal. There was some bilobar prostatic hypertrophy. The bladder was then systematically examined in its entirety. There was no evidence for any bladder tumors, stones, or  other mucosal pathology.    Attention then turned to the right ureteral orifice. A 0.038 zip wire was passed through the right orifice and over the wire a 5 Fr open ended catheter was inserted and passed up to the level of the renal pelvis. Aspirate was obtained and sent off as renal pelvis urine for culture. Omnipaque  contrast was injected through the ureteral catheter and a retrograde pyelogram was performed with findings as dictated above. The wire was then replaced and the open ended catheter was removed. There was no purulence, but murky appearing urine drained from the UO  A 6Fr x 26cm ureteral stent was advance over the wire. The stent was positioned appropriately under fluoroscopic and cystoscopic guidance.  The wire was then removed with an adequate stent curl noted in the renal pelvis as well as in the bladder.  The bladder was then emptied and the procedure ended.  The patient appeared to tolerate the procedure well and without complications.  The patient was able to be awakened and transferred to the recovery unit in satisfactory condition.   Plan:  will plan for staged intervention - ESWL vs URS  G Herlene Foot MD [facility]  Pager: [phone]

## 2023-10-21 ENCOUNTER — Encounter (HOSPITAL_COMMUNITY): Payer: Self-pay | Admitting: Urology

## 2023-10-21 NOTE — Anesthesia Postprocedure Evaluation (Signed)
 Anesthesia Post Note  Patient: Derrick Barr  Procedure(s) Performed: CYSTOSCOPY, WITH STENT INSERTION, RETROGRADE (Right)     Patient location during evaluation: PACU Anesthesia Type: General Level of consciousness: awake Pain management: pain level controlled Vital Signs Assessment: post-procedure vital signs reviewed and stable Respiratory status: spontaneous breathing, nonlabored ventilation and respiratory function stable Cardiovascular status: blood pressure returned to baseline and stable Postop Assessment: no apparent nausea or vomiting Anesthetic complications: no   No notable events documented.  Last Vitals:  Vitals:   10/20/23 2000 10/20/23 2030  BP: 135/88 134/85  Pulse: 69 69  Resp: 16 16  Temp: 36.4 C 36.4 C  SpO2: 95% 95%    Last Pain:  Vitals:   10/20/23 2030  TempSrc:   PainSc: 0-No pain                 [address] Stars[address] Morning

## 2023-10-22 LAB — URINE CULTURE: Culture: NO GROWTH

## 2023-10-24 NOTE — Progress Notes (Signed)
 History of Present Illness:  53--seen for evaluation of a right proximal ureteral stone.  He has a prior history of urolithiasis, followed by Dr. Alvaro at [facility] urology.  Last seen in 2021 after bilateral ureteroscopy.  Did not return as requested.  He had urgent stenting later that day.  He is doing well with the stent.  No real problems since his stent placement.   Past Medical History:  Past Medical History:  Diagnosis Date   Anxiety    Chronic gout followed by pcp   08-11-2019 per pt last episode left knee 06/ 2021   Depression    Diabetes mellitus without complication (HCC)    Dysuria    Hematuria    History of kidney stones    Hypertension    OA (osteoarthritis)    knees, hips   Prediabetes    Renal calculus, bilateral    Urgency of urination     Past Surgical History:  Past Surgical History:  Procedure Laterality Date   CYSTOSCOPY WITH RETROGRADE PYELOGRAM, URETEROSCOPY AND STENT PLACEMENT Bilateral 07/31/2019   Procedure: CYSTOSCOPY WITH RETROGRADE PYELOGRAM, URETEROSCOPY AND STENT PLACEMENT;  Surgeon: Alvaro Hummer, MD;  [facility];  Service: Urology;  Laterality: Bilateral;   CYSTOSCOPY WITH RETROGRADE PYELOGRAM, URETEROSCOPY AND STENT PLACEMENT Bilateral 08/18/2019   Procedure: CYSTOSCOPY WITH RETROGRADE PYELOGRAM, URETEROSCOPY AND STENT PLACEMENT;  Surgeon: Alvaro Hummer, MD;  Location: [facility];  Service: Urology;  Laterality: Bilateral;  75 MINS   CYSTOSCOPY WITH STENT PLACEMENT Right 10/20/2023   Procedure: CYSTOSCOPY, WITH STENT INSERTION, RETROGRADE;  Surgeon: Lovie Arlyss CROME, MD;  Location[facility];  Service: Urology;  Laterality: Right;   EYE SURGERY Left age 53   BB 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   HOLMIUM LASER APPLICATION Bilateral 08/18/2019   Procedure: HOLMIUM LASER APPLICATION;  Surgeon: Alvaro Hummer, MD;  Location: [facility];  Service: Urology;  Laterality: Bilateral;   LEFT HEART CATH AND  CORONARY ANGIOGRAPHY N/A 02/20/2021   Procedure: LEFT HEART CATH AND CORONARY ANGIOGRAPHY;  Surgeon: Dann Candyce RAMAN, MD;  Location: [facility] INVASIVE CV LAB;  Service: Cardiovascular;  Laterality: N/A;   TOTAL HIP ARTHROPLASTY Left 06/28/2019   Procedure: LEFT TOTAL HIP ARTHROPLASTY ANTERIOR APPROACH;  Surgeon: Jerri Kay HERO, MD;  Location: [facility];  Service: Orthopedics;  Laterality: Left;   Ventricular Tachycadia     WISDOM TOOTH EXTRACTION      Allergies:  Allergies  Allergen Reactions   Augmentin  [Amoxicillin -Pot Clavulanate] Hives    Family History:  Family History  Problem Relation Age of Onset   Heart attack Father 69    Social History:  Social History   Tobacco Use   Smoking status: Never   Smokeless tobacco: Never  Vaping Use   Vaping status: Former   Quit date: 08/10/2017  Substance Use Topics   Alcohol use: No    Alcohol/week: 0.0 standard drinks of alcohol   Drug use: Never    Review of symptoms:    Results:  Operative notes reviewed Assessment: Right mid ureteral stone, status post stent, patient needs definitive management   Plan: I discussed ureteroscopy versus shockwave lithotripsy, benefits of each, risks of each as well as stone free rates.  He would like to go with lithotripsy.  We will set that up to be done in [place] next week if possible

## 2023-10-27 ENCOUNTER — Ambulatory Visit (INDEPENDENT_AMBULATORY_CARE_PROVIDER_SITE_OTHER): Admitting: Urology

## 2023-10-27 ENCOUNTER — Other Ambulatory Visit: Payer: Self-pay

## 2023-10-27 VITALS — BP 160/95 | HR 83

## 2023-10-27 DIAGNOSIS — N201 Calculus of ureter: Secondary | ICD-10-CM

## 2023-10-27 DIAGNOSIS — N200 Calculus of kidney: Secondary | ICD-10-CM

## 2023-10-27 NOTE — Addendum Note (Signed)
 Addended by: ANN VELERIA SAUNDERS on: 10/27/2023 09:33 AM   Modules accepted: Orders

## 2023-10-31 ENCOUNTER — Encounter (HOSPITAL_COMMUNITY): Payer: Self-pay

## 2023-10-31 ENCOUNTER — Encounter (HOSPITAL_COMMUNITY)
Admission: RE | Admit: 2023-10-31 | Discharge: 2023-10-31 | Disposition: A | Discharge status: Home / Self Care | Source: Ambulatory Visit | Attending: Urology | Admitting: Urology

## 2023-10-31 HISTORY — DX: Cardiac arrhythmia, unspecified: I49.9

## 2023-10-31 HISTORY — DX: Tachycardia, unspecified: R00.0

## 2023-11-04 ENCOUNTER — Ambulatory Visit (HOSPITAL_COMMUNITY)
Admission: RE | Admit: 2023-11-04 | Discharge: 2023-11-04 | Disposition: A | Discharge status: Home / Self Care | Attending: Urology | Admitting: Urology

## 2023-11-04 ENCOUNTER — Encounter (HOSPITAL_COMMUNITY): Payer: Self-pay | Admitting: Urology

## 2023-11-04 ENCOUNTER — Other Ambulatory Visit: Payer: Self-pay

## 2023-11-04 ENCOUNTER — Ambulatory Visit (HOSPITAL_COMMUNITY)

## 2023-11-04 ENCOUNTER — Encounter (HOSPITAL_COMMUNITY)
Admission: RE | Disposition: A | Discharge status: Home / Self Care | Payer: Self-pay | Source: Home / Self Care | Attending: Urology

## 2023-11-04 DIAGNOSIS — N201 Calculus of ureter: Secondary | ICD-10-CM | POA: Diagnosis present

## 2023-11-04 DIAGNOSIS — I10 Essential (primary) hypertension: Secondary | ICD-10-CM | POA: Insufficient documentation

## 2023-11-04 DIAGNOSIS — E119 Type 2 diabetes mellitus without complications: Secondary | ICD-10-CM | POA: Diagnosis not present

## 2023-11-04 DIAGNOSIS — N132 Hydronephrosis with renal and ureteral calculous obstruction: Secondary | ICD-10-CM

## 2023-11-04 HISTORY — PX: EXTRACORPOREAL SHOCK WAVE LITHOTRIPSY: SHX1557

## 2023-11-04 LAB — GLUCOSE, CAPILLARY: Glucose-Capillary: 142 mg/dL — ABNORMAL HIGH (ref 70–99)

## 2023-11-04 SURGERY — LITHOTRIPSY, ESWL
Anesthesia: LOCAL | Laterality: Right

## 2023-11-04 MED ORDER — DIPHENHYDRAMINE HCL 25 MG PO CAPS
25.0000 mg | ORAL_CAPSULE | ORAL | Status: AC
  Administered 2023-11-04: 25 mg via ORAL
  Filled 2023-11-04: qty 1

## 2023-11-04 MED ORDER — SODIUM CHLORIDE 0.9 % IV SOLN: INTRAVENOUS | Status: DC

## 2023-11-04 MED ORDER — DIAZEPAM 5 MG PO TABS
10.0000 mg | ORAL_TABLET | Freq: Once | ORAL | Status: AC
  Administered 2023-11-04: 10 mg via ORAL
  Filled 2023-11-04: qty 2

## 2023-11-04 NOTE — Op Note (Signed)
See [facility] OP note scanned into chart. 

## 2023-11-04 NOTE — Discharge Instructions (Signed)
 See [facility] discharge instructions in chart.

## 2023-11-04 NOTE — H&P (Signed)
 H&P  Chief Complaint: Rt sided stone  History of Present Illness: 53  yo male presents for ESL of a symptomatic recently stented Rt upper ureteral stone.  Past Medical History:  Diagnosis Date   Anxiety    Chronic gout followed by pcp   08-11-2019 per pt last episode left knee 06/ 2021   Depression    Diabetes mellitus without complication (HCC)    Dysrhythmia    Dysuria    Hematuria    History of kidney stones    Hypertension    OA (osteoarthritis)    knees, hips   Prediabetes    Tachycardia    Urgency of urination     Past Surgical History:  Procedure Laterality Date   CYSTOSCOPY WITH RETROGRADE PYELOGRAM, URETEROSCOPY AND STENT PLACEMENT Bilateral 07/31/2019   Procedure: CYSTOSCOPY WITH RETROGRADE PYELOGRAM, URETEROSCOPY AND STENT PLACEMENT;  Surgeon: Alvaro Hummer, MD;  [facility];  Service: Urology;  Laterality: Bilateral;   CYSTOSCOPY WITH RETROGRADE PYELOGRAM, URETEROSCOPY AND STENT PLACEMENT Bilateral 08/18/2019   Procedure: CYSTOSCOPY WITH RETROGRADE PYELOGRAM, URETEROSCOPY AND STENT PLACEMENT;  Surgeon: Alvaro Hummer, MD;  Location: [facility];  Service: Urology;  Laterality: Bilateral;  75 MINS   CYSTOSCOPY WITH STENT PLACEMENT Right 10/20/2023   Procedure: CYSTOSCOPY, WITH STENT INSERTION, RETROGRADE;  Surgeon: Lovie Arlyss CROME, MD;  [facility];  Service: Urology;  Laterality: Right;   EYE SURGERY Left age 25   BB 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   HOLMIUM LASER APPLICATION Bilateral 08/18/2019   Procedure: HOLMIUM LASER APPLICATION;  Surgeon: Alvaro Hummer, MD;  Location: [facility];  Service: Urology;  Laterality: Bilateral;   LEFT HEART CATH AND CORONARY ANGIOGRAPHY N/A 02/20/2021   Procedure: LEFT HEART CATH AND CORONARY ANGIOGRAPHY;  Surgeon: Dann Candyce RAMAN, MD;  Location: [facility] INVASIVE CV LAB;  Service: Cardiovascular;  Laterality: N/A;   TOTAL HIP ARTHROPLASTY Left 06/28/2019   Procedure: LEFT TOTAL HIP  ARTHROPLASTY ANTERIOR APPROACH;  Surgeon: Jerri Kay HERO, MD;  Location: [facility];  Service: Orthopedics;  Laterality: Left;   Ventricular Tachycadia     WISDOM TOOTH EXTRACTION      Home Medications:    Allergies:  Allergies  Allergen Reactions   Augmentin  [Amoxicillin -Pot Clavulanate] Hives    Family History  Problem Relation Age of Onset   Heart attack Father 72    Social History:  reports that he has never smoked. He has never used smokeless tobacco. He reports that he does not drink alcohol and does not use drugs.  ROS: A complete review of systems was performed.  All systems are negative except for pertinent findings as noted.  Physical Exam:  Vital signs in last 24 hours: BP 139/88   Pulse 83   Temp 98.2 F (36.8 C) (Oral)   Resp 18   Ht 5' 11 (1.803 m)   Wt 108.9 kg   SpO2 96%   BMI 33.48 kg/m  Constitutional:  Alert and oriented, No acute distress Cardiovascular: Regular rate  Respiratory: Normal respiratory effort Neurologic: Grossly intact, no focal deficits Psychiatric: Normal mood and affect  I have reviewed prior pt notes I have independently reviewed prior imaging    Impression/Assessment:  Rt ureteral calculus  Plan:  ESL

## 2023-11-05 ENCOUNTER — Encounter (HOSPITAL_COMMUNITY): Payer: Self-pay | Admitting: Urology

## 2023-11-16 NOTE — Progress Notes (Signed)
 History of Present Illness:  9.29.2025--seen for evaluation of a right proximal ureteral stone.  He has a prior history of urolithiasis, followed by Dr. Alvaro at [facility] urology.  Last seen in 2021 after bilateral ureteroscopy.  Did not return as requested.  He had urgent stenting later that day.  10.14.2025--Underwent ESL  10.27.2025: Here for stent removal.  He passed quite a few small stones.   Past Medical History:  Past Medical History:  Diagnosis Date   Anxiety    Chronic gout followed by pcp   08-11-2019 per pt last episode left knee 06/ 2021   Depression    Diabetes mellitus without complication (HCC)    Dysrhythmia    Dysuria    Hematuria    History of kidney stones    Hypertension    OA (osteoarthritis)    knees, hips   Prediabetes    Tachycardia    Urgency of urination     Past Surgical History:  Past Surgical History:  Procedure Laterality Date   CYSTOSCOPY WITH RETROGRADE PYELOGRAM, URETEROSCOPY AND STENT PLACEMENT Bilateral 07/31/2019   Procedure: CYSTOSCOPY WITH RETROGRADE PYELOGRAM, URETEROSCOPY AND STENT PLACEMENT;  Surgeon: Alvaro Hummer, MD;  Location: [facility];  Service: Urology;  Laterality: Bilateral;   CYSTOSCOPY WITH RETROGRADE PYELOGRAM, URETEROSCOPY AND STENT PLACEMENT Bilateral 08/18/2019   Procedure: CYSTOSCOPY WITH RETROGRADE PYELOGRAM, URETEROSCOPY AND STENT PLACEMENT;  Surgeon: Alvaro Hummer, MD;  Location: [facility];  Service: Urology;  Laterality: Bilateral;  75 MINS   CYSTOSCOPY WITH STENT PLACEMENT Right 10/20/2023   Procedure: CYSTOSCOPY, WITH STENT INSERTION, RETROGRADE;  Surgeon: Lovie Arlyss CROME, MD;  Location: [facility];  Service: Urology;  Laterality: Right;   EXTRACORPOREAL SHOCK WAVE LITHOTRIPSY Right 11/04/2023   Procedure: LITHOTRIPSY, ESWL;  Surgeon: Matilda Senior, MD;  Location: [facility];  Service: Urology;  Laterality: Right;   EYE SURGERY Left age 53   BB 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   HOLMIUM LASER  APPLICATION Bilateral 08/18/2019   Procedure: HOLMIUM LASER APPLICATION;  Surgeon: Alvaro Hummer, MD;  Location: [facility];  Service: Urology;  Laterality: Bilateral;   LEFT HEART CATH AND CORONARY ANGIOGRAPHY N/A 02/20/2021   Procedure: LEFT HEART CATH AND CORONARY ANGIOGRAPHY;  Surgeon: Dann Candyce RAMAN, MD;  Location: [facility] INVASIVE CV LAB;  Service: Cardiovascular;  Laterality: N/A;   TOTAL HIP ARTHROPLASTY Left 06/28/2019   Procedure: LEFT TOTAL HIP ARTHROPLASTY ANTERIOR APPROACH;  Surgeon: Jerri Kay HERO, MD;  Location: [facility];  Service: Orthopedics;  Laterality: Left;   Ventricular Tachycadia     WISDOM TOOTH EXTRACTION      Allergies:  Allergies  Allergen Reactions   Augmentin  [Amoxicillin -Pot Clavulanate] Hives    Family History:  Family History  Problem Relation Age of Onset   Heart attack Father 35    Social History:  Social History   Tobacco Use   Smoking status: Never   Smokeless tobacco: Never  Vaping Use   Vaping status: Former   Quit date: 08/10/2017  Substance Use Topics   Alcohol use: No    Alcohol/week: 0.0 standard drinks of alcohol   Drug use: Never    Cystoscopy/Stent extraction Procedure Note:    There was a malfunction of the scope with inadequate visualization.  Poor visualization of the patient's urothelium as well as the stent grasper.  The cystoscopy was aborted.  We will bring him back in a week to use a new scope.

## 2023-11-17 ENCOUNTER — Ambulatory Visit (INDEPENDENT_AMBULATORY_CARE_PROVIDER_SITE_OTHER): Admitting: Urology

## 2023-11-17 ENCOUNTER — Ambulatory Visit (HOSPITAL_BASED_OUTPATIENT_CLINIC_OR_DEPARTMENT_OTHER)
Admission: RE | Admit: 2023-11-17 | Discharge: 2023-11-17 | Disposition: A | Discharge status: Home / Self Care | Source: Ambulatory Visit | Attending: Urology | Admitting: Urology

## 2023-11-17 VITALS — BP 158/88 | HR 78 | Ht 71.0 in | Wt 245.0 lb

## 2023-11-17 DIAGNOSIS — N200 Calculus of kidney: Secondary | ICD-10-CM | POA: Insufficient documentation

## 2023-11-17 LAB — MICROSCOPIC EXAMINATION: RBC, Urine: >30 /HPF — AB (ref 0–2)

## 2023-11-17 LAB — URINALYSIS, ROUTINE W REFLEX MICROSCOPIC
Bilirubin, UA: NEGATIVE
Glucose, UA: NEGATIVE
Ketones, UA: NEGATIVE
Nitrite, UA: NEGATIVE
Specific Gravity, UA: 1.02 (ref 1.005–1.030)
Urobilinogen, Ur: 0.2 mg/dL (ref 0.2–1.0)
pH, UA: 7 (ref 5.0–7.5)

## 2023-11-23 NOTE — Progress Notes (Signed)
 Assessment: Right ureteral stone, status post lithotripsy.  Fragmented but it has not passed distally yet.  Plan: - Cystoscopy and stent extraction cover with Cipro date  - Stone prevention diet discussed  - I will have him get a KUB at [facility] in 3 weeks.  Call with results and follow-up  History of Present Illness:  9.29.2025--seen for evaluation of a right proximal ureteral stone.  He has a prior history of urolithiasis, followed by Dr. Alvaro at [facility] urology.  Last seen in 2021 after bilateral ureteroscopy.  Did not return as requested.  He had urgent stenting later that day.  10.14.2025--Underwent ESL  10.27.2025: Here for stent removal.  Because of technical issues with the scope, stent was not able to be extracted.  11.3.2025: Here for stent removal.  New scope brought in   Past Medical History:  Past Medical History:  Diagnosis Date   Anxiety    Chronic gout followed by pcp   08-11-2019 per pt last episode left knee 06/ 2021   Depression    Diabetes mellitus without complication (HCC)    Dysrhythmia    Dysuria    Hematuria    History of kidney stones    Hypertension    OA (osteoarthritis)    knees, hips   Prediabetes    Tachycardia    Urgency of urination     Past Surgical History:  Past Surgical History:  Procedure Laterality Date   CYSTOSCOPY WITH RETROGRADE PYELOGRAM, URETEROSCOPY AND STENT PLACEMENT Bilateral 07/31/2019   Procedure: CYSTOSCOPY WITH RETROGRADE PYELOGRAM, URETEROSCOPY AND STENT PLACEMENT;  Surgeon: Alvaro Hummer, MD;  Location: [facility];  Service: Urology;  Laterality: Bilateral;   CYSTOSCOPY WITH RETROGRADE PYELOGRAM, URETEROSCOPY AND STENT PLACEMENT Bilateral 08/18/2019   Procedure: CYSTOSCOPY WITH RETROGRADE PYELOGRAM, URETEROSCOPY AND STENT PLACEMENT;  Surgeon: Alvaro Hummer, MD;  Location: [facility];  Service: Urology;  Laterality: Bilateral;  75 MINS   CYSTOSCOPY WITH STENT PLACEMENT Right 10/20/2023    Procedure: CYSTOSCOPY, WITH STENT INSERTION, RETROGRADE;  Surgeon: Lovie Arlyss CROME, MD;  Location: [facility];  Service: Urology;  Laterality: Right;   EXTRACORPOREAL SHOCK WAVE LITHOTRIPSY Right 11/04/2023   Procedure: LITHOTRIPSY, ESWL;  Surgeon: Matilda Senior, MD;  Location: [facility];  Service: Urology;  Laterality: Right;   EYE SURGERY Left age 53   BB 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   HOLMIUM LASER APPLICATION Bilateral 08/18/2019   Procedure: HOLMIUM LASER APPLICATION;  Surgeon: Alvaro Hummer, MD;  Location: [facility];  Service: Urology;  Laterality: Bilateral;   LEFT HEART CATH AND CORONARY ANGIOGRAPHY N/A 02/20/2021   Procedure: LEFT HEART CATH AND CORONARY ANGIOGRAPHY;  Surgeon: Dann Candyce RAMAN, MD;  Location: [facility] INVASIVE CV LAB;  Service: Cardiovascular;  Laterality: N/A;   TOTAL HIP ARTHROPLASTY Left 06/28/2019   Procedure: LEFT TOTAL HIP ARTHROPLASTY ANTERIOR APPROACH;  Surgeon: Jerri Kay HERO, MD;  Location: [facility];  Service: Orthopedics;  Laterality: Left;   Ventricular Tachycadia     WISDOM TOOTH EXTRACTION      Allergies:  Allergies  Allergen Reactions   Augmentin  [Amoxicillin -Pot Clavulanate] Hives    Family History:  Family History  Problem Relation Age of Onset   Heart attack Father 10    Social History:  Social History   Tobacco Use   Smoking status: Never   Smokeless tobacco: Never  Vaping Use   Vaping status: Former   Quit date: 08/10/2017  Substance Use Topics   Alcohol use: No  Alcohol/week: 0.0 standard drinks of alcohol   Drug use: Never    Cystoscopy/Stent extraction Procedure Note:   Patient's penis was prepped and draped.  2% viscous lidocaine  was placed within the urethra.  The flexible cystoscope was passed easily into the bladder.  No urethral lesions were noted.  Prostate was nonobstructive.  Stent was identified, grasped and extracted without difficulty.  Patient tolerated procedure well.  Prior 24-hour urine results  reviewed.  He does have low urine volume, low urine citrate levels, normal sodium and calcium  levels.

## 2023-11-24 ENCOUNTER — Ambulatory Visit: Admitting: Urology

## 2023-11-24 ENCOUNTER — Encounter (HOSPITAL_COMMUNITY): Payer: Self-pay | Admitting: Urology

## 2023-11-24 VITALS — BP 161/103 | HR 77 | Ht 71.0 in | Wt 245.0 lb

## 2023-11-24 DIAGNOSIS — N200 Calculus of kidney: Secondary | ICD-10-CM

## 2023-11-24 DIAGNOSIS — N201 Calculus of ureter: Secondary | ICD-10-CM | POA: Diagnosis not present

## 2023-11-24 LAB — URINALYSIS, ROUTINE W REFLEX MICROSCOPIC
Bilirubin, UA: NEGATIVE
Glucose, UA: NEGATIVE
Ketones, UA: NEGATIVE
Nitrite, UA: NEGATIVE
Specific Gravity, UA: 1.015 (ref 1.005–1.030)
Urobilinogen, Ur: 0.2 mg/dL (ref 0.2–1.0)
pH, UA: 5.5 (ref 5.0–7.5)

## 2023-11-24 LAB — MICROSCOPIC EXAMINATION: RBC, Urine: >30 /HPF — AB (ref 0–2)

## 2023-11-24 MED ORDER — CIPROFLOXACIN HCL 500 MG PO TABS
500.0000 mg | ORAL_TABLET | Freq: Once | ORAL | Status: AC
  Administered 2023-11-24: 500 mg via ORAL

## 2023-12-26 ENCOUNTER — Telehealth: Payer: Self-pay | Admitting: Urology

## 2023-12-26 NOTE — Telephone Encounter (Signed)
 Patient needs another order for his KUB it has expired. Can you please put in another order and then once he has it he will need a follow up appt.  Rosaline
